# Patient Record
Sex: Female | Born: 1970 | Race: White | Hispanic: No | Marital: Married | Smoking: Never smoker
Health system: Southern US, Community
[De-identification: ages and names within clinical notes are randomized; demographics above are authoritative.]

## PROBLEM LIST (undated history)

## (undated) DIAGNOSIS — R51 Headache: Secondary | ICD-10-CM

## (undated) DIAGNOSIS — O34219 Maternal care for unspecified type scar from previous cesarean delivery: Secondary | ICD-10-CM

## (undated) DIAGNOSIS — N390 Urinary tract infection, site not specified: Secondary | ICD-10-CM

## (undated) DIAGNOSIS — E0590 Thyrotoxicosis, unspecified without thyrotoxic crisis or storm: Secondary | ICD-10-CM

---

## 2003-12-02 ENCOUNTER — Other Ambulatory Visit: Admission: RE | Admit: 2003-12-02 | Discharge: 2003-12-02 | Payer: Self-pay | Admitting: Obstetrics and Gynecology

## 2004-01-29 ENCOUNTER — Emergency Department (HOSPITAL_COMMUNITY): Admission: EM | Admit: 2004-01-29 | Discharge: 2004-01-29 | Payer: Self-pay

## 2004-11-18 ENCOUNTER — Other Ambulatory Visit: Admission: RE | Admit: 2004-11-18 | Discharge: 2004-11-18 | Payer: Self-pay | Admitting: Obstetrics and Gynecology

## 2005-05-11 ENCOUNTER — Emergency Department (HOSPITAL_COMMUNITY): Admission: EM | Admit: 2005-05-11 | Discharge: 2005-05-11 | Payer: Self-pay | Admitting: Family Medicine

## 2006-04-14 ENCOUNTER — Ambulatory Visit (HOSPITAL_COMMUNITY): Admission: RE | Admit: 2006-04-14 | Discharge: 2006-04-14 | Payer: Self-pay | Admitting: Obstetrics and Gynecology

## 2006-04-27 ENCOUNTER — Ambulatory Visit (HOSPITAL_COMMUNITY): Admission: RE | Admit: 2006-04-27 | Discharge: 2006-04-27 | Payer: Self-pay | Admitting: Obstetrics and Gynecology

## 2006-05-31 ENCOUNTER — Ambulatory Visit (HOSPITAL_COMMUNITY): Admission: RE | Admit: 2006-05-31 | Discharge: 2006-05-31 | Payer: Self-pay | Admitting: Obstetrics and Gynecology

## 2006-06-27 ENCOUNTER — Ambulatory Visit (HOSPITAL_COMMUNITY): Admission: RE | Admit: 2006-06-27 | Discharge: 2006-06-27 | Payer: Self-pay | Admitting: Obstetrics and Gynecology

## 2006-07-25 ENCOUNTER — Ambulatory Visit (HOSPITAL_COMMUNITY): Admission: RE | Admit: 2006-07-25 | Discharge: 2006-07-25 | Payer: Self-pay | Admitting: Obstetrics and Gynecology

## 2006-07-27 ENCOUNTER — Inpatient Hospital Stay (HOSPITAL_COMMUNITY): Admission: AD | Admit: 2006-07-27 | Discharge: 2006-07-27 | Payer: Self-pay | Admitting: Obstetrics & Gynecology

## 2006-08-15 ENCOUNTER — Ambulatory Visit (HOSPITAL_COMMUNITY): Admission: RE | Admit: 2006-08-15 | Discharge: 2006-08-15 | Payer: Self-pay | Admitting: Obstetrics and Gynecology

## 2006-08-29 ENCOUNTER — Ambulatory Visit (HOSPITAL_COMMUNITY): Admission: RE | Admit: 2006-08-29 | Discharge: 2006-08-29 | Payer: Self-pay | Admitting: Obstetrics and Gynecology

## 2006-09-04 ENCOUNTER — Inpatient Hospital Stay (HOSPITAL_COMMUNITY): Admission: AD | Admit: 2006-09-04 | Discharge: 2006-09-04 | Payer: Self-pay | Admitting: Obstetrics and Gynecology

## 2006-09-12 ENCOUNTER — Encounter: Payer: Self-pay | Admitting: Obstetrics and Gynecology

## 2006-09-12 ENCOUNTER — Inpatient Hospital Stay (HOSPITAL_COMMUNITY): Admission: AD | Admit: 2006-09-12 | Discharge: 2006-09-15 | Payer: Self-pay | Admitting: Obstetrics and Gynecology

## 2006-09-13 ENCOUNTER — Encounter (INDEPENDENT_AMBULATORY_CARE_PROVIDER_SITE_OTHER): Payer: Self-pay | Admitting: Obstetrics and Gynecology

## 2008-06-10 ENCOUNTER — Inpatient Hospital Stay (HOSPITAL_COMMUNITY): Admission: AD | Admit: 2008-06-10 | Discharge: 2008-06-13 | Payer: Self-pay | Admitting: Obstetrics & Gynecology

## 2009-03-22 IMAGING — US US OB FOLLOW-UP
1 series · 14 of 28 positions shown · non-contrast
Comparison: none

OBSTETRICAL ULTRASOUND:
 This ultrasound was performed in The [HOSPITAL], and the [facility] report will be stored to [REDACTED] PACS.

[Series 1: us ob follow-up · 14 of 44 slices shown]
[im 2/44]
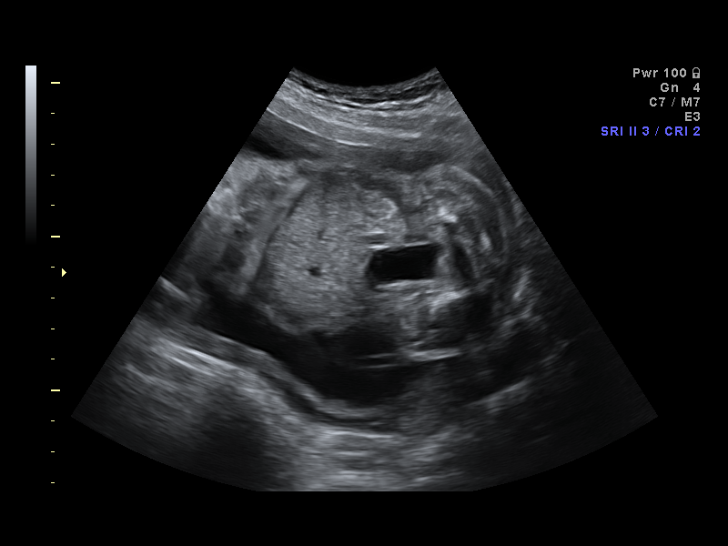
[im 5/44]
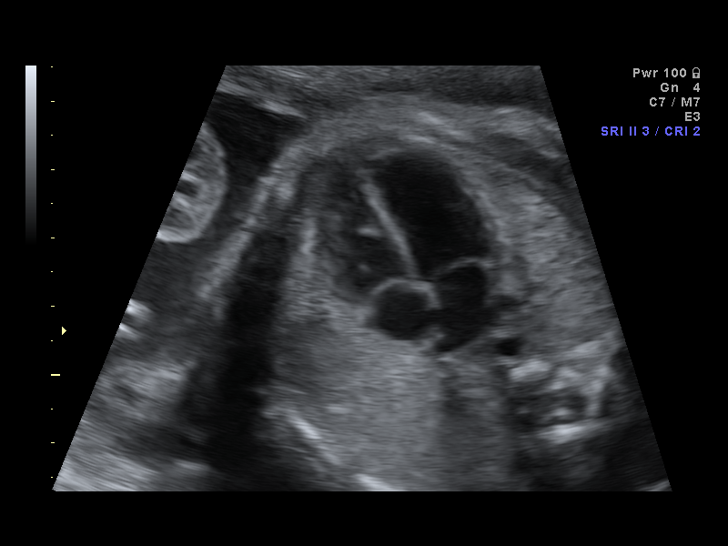
[im 8/44]
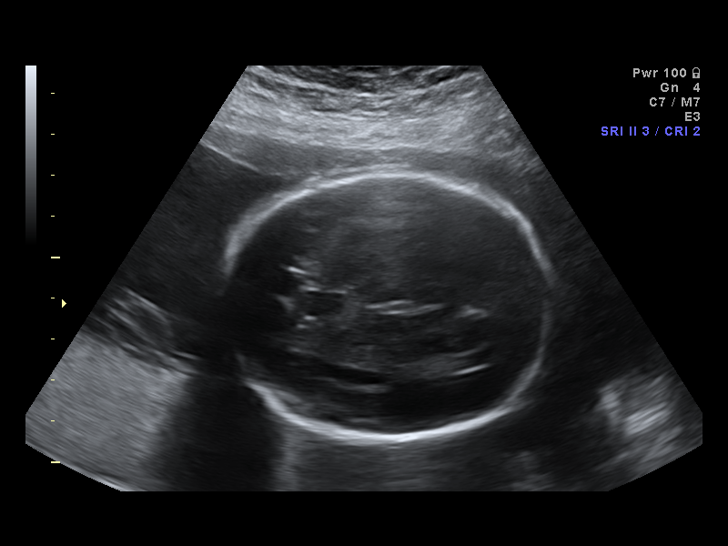
[im 12/44]
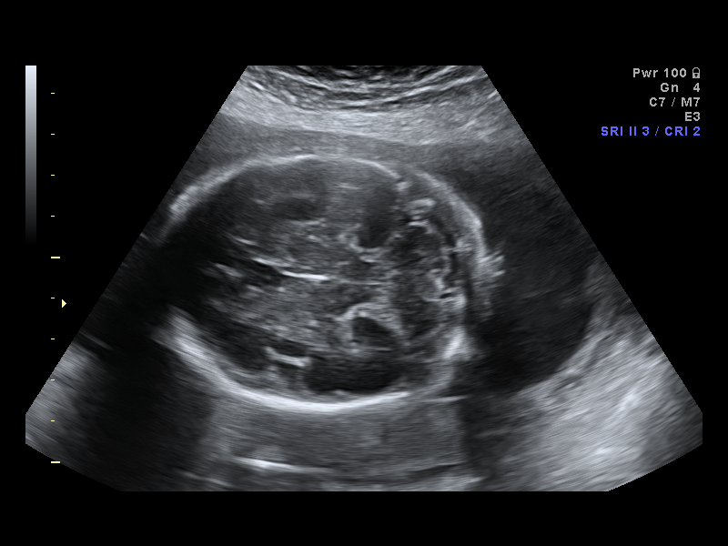
[im 15/44]
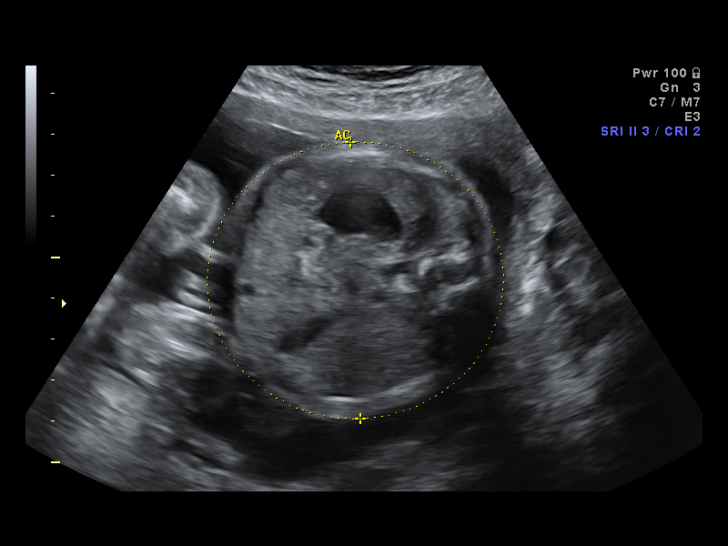
[im 18/44]
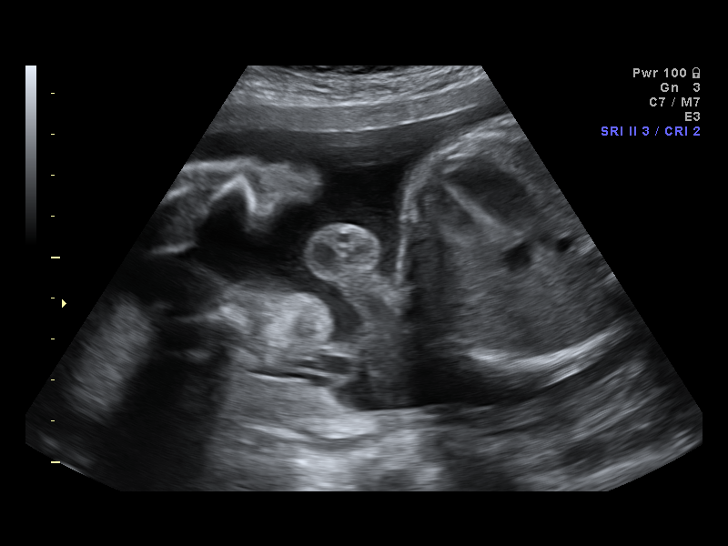
[im 21/44]
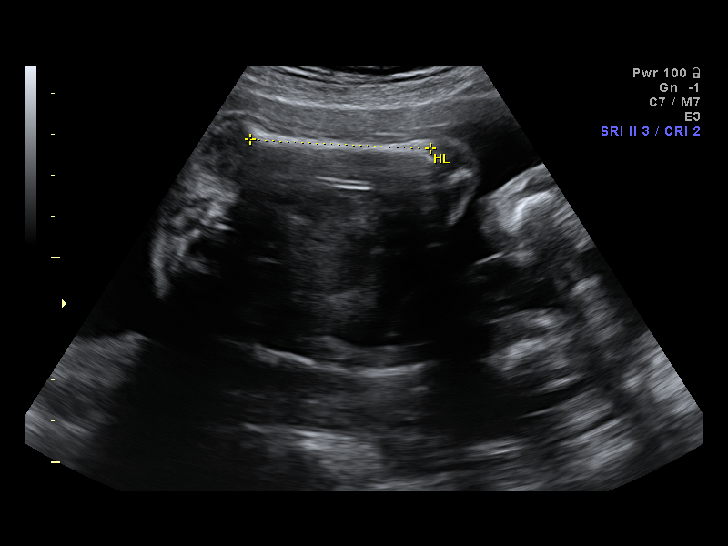
[im 24/44]
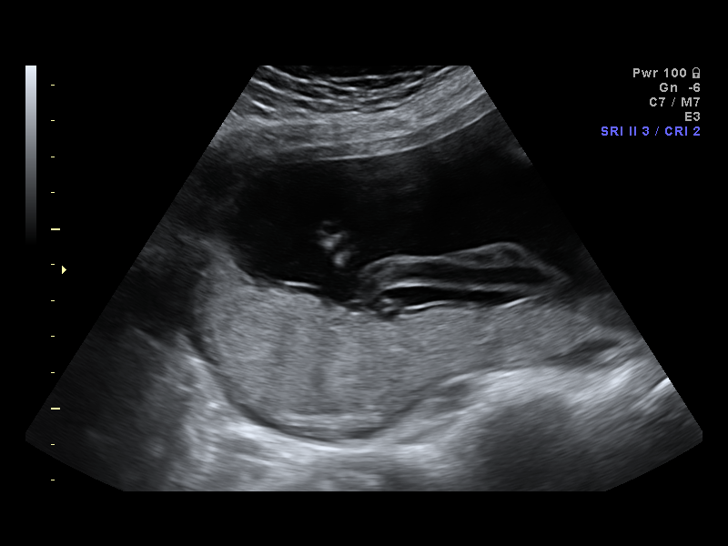
[im 28/44]
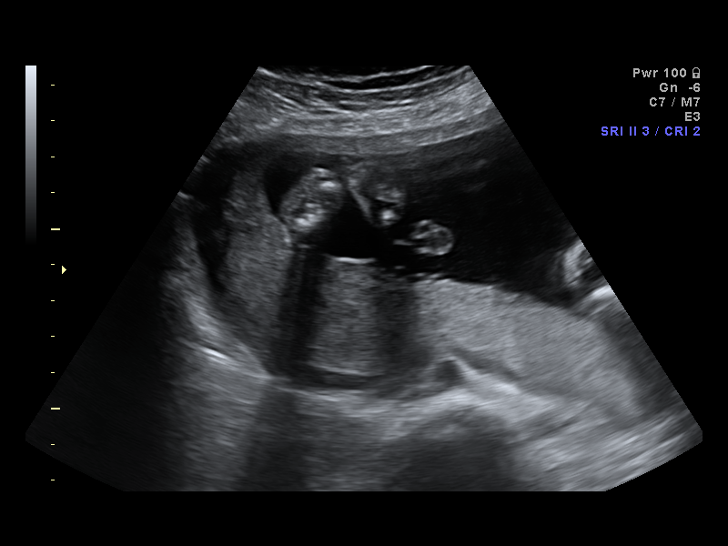
[im 31/44]
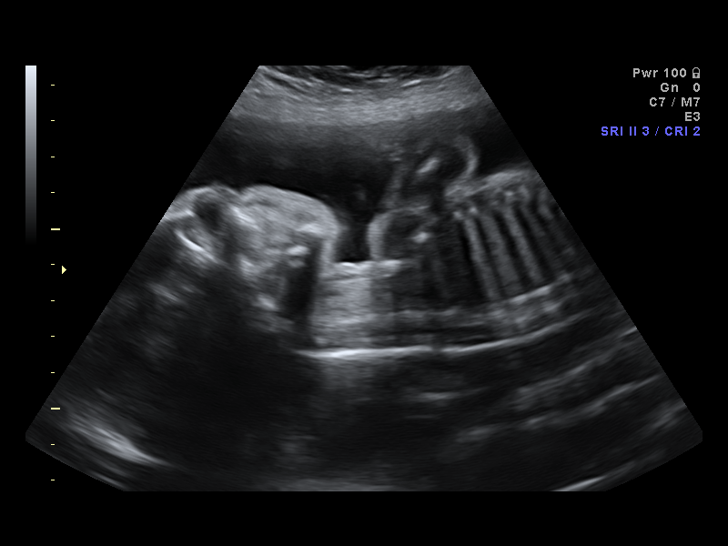
[im 34/44]
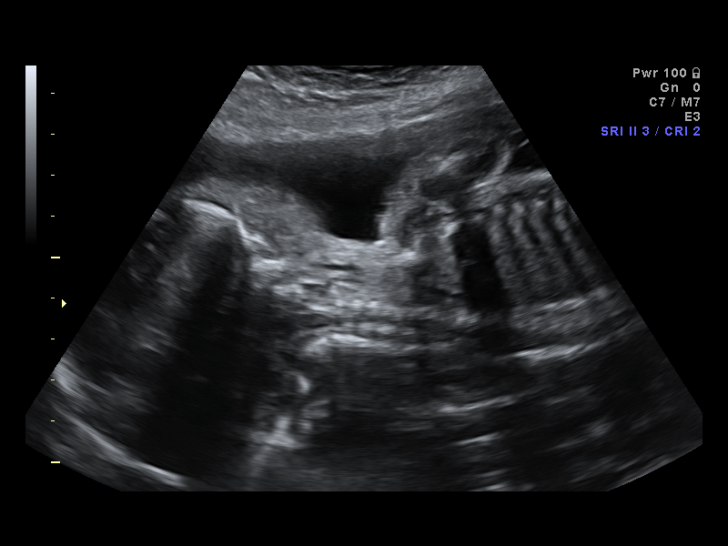
[im 37/44]
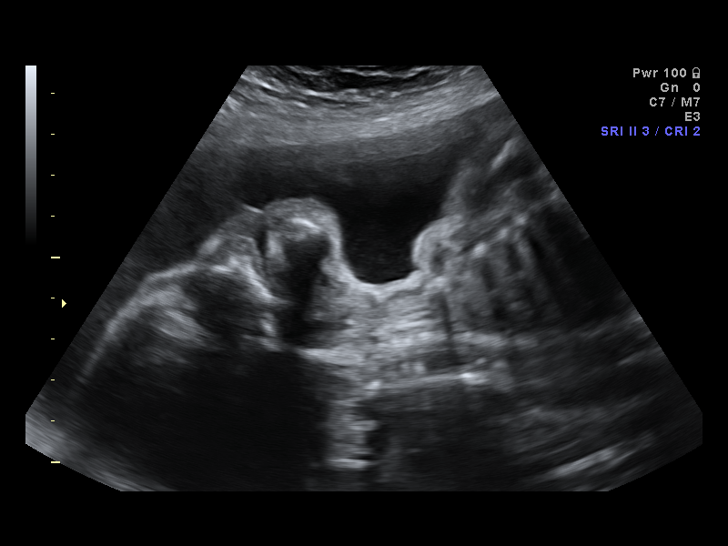
[im 40/44]
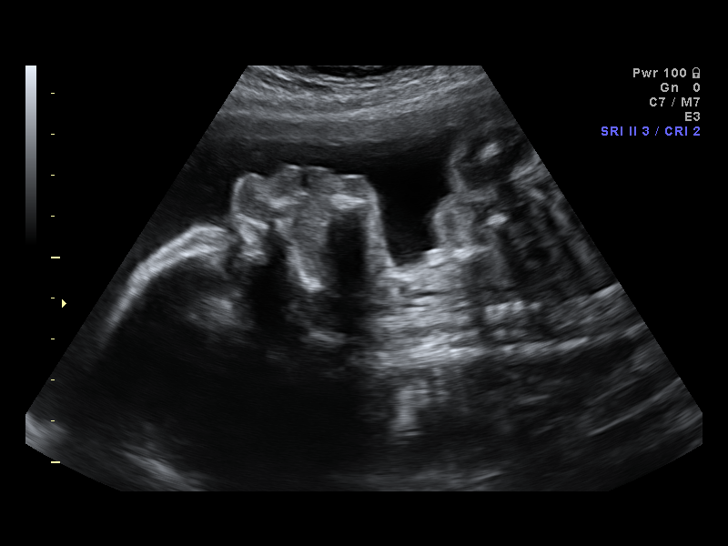
[im 44/44]
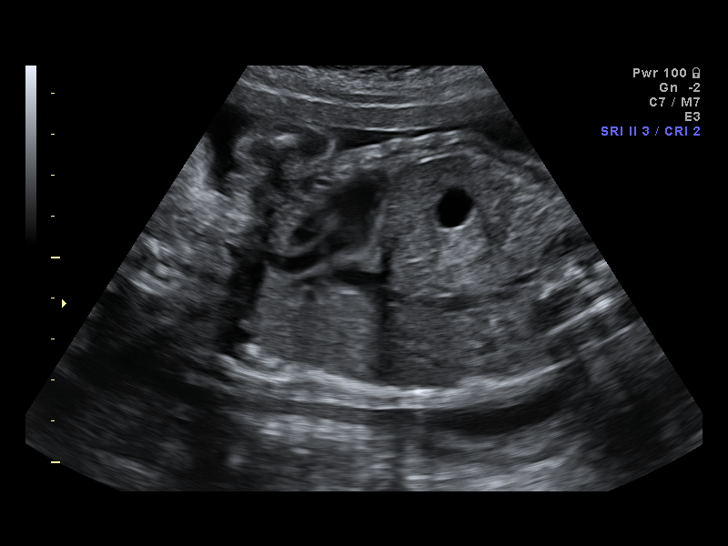

[14 of 28 positions shown; findings below may reference images not displayed]

IMPRESSION: The [facility] report has also been faxed to the ordering physician.

## 2009-05-10 IMAGING — US US OB FOLLOW-UP
1 series · 14 of 28 positions shown · non-contrast
Comparison: none

OBSTETRICAL ULTRASOUND:
 This ultrasound was performed in The [HOSPITAL], and the [facility] report will be stored to [REDACTED] PACS.

[Series 1: us ob follow-up · 14 of 30 slices shown]
[im 2/30]
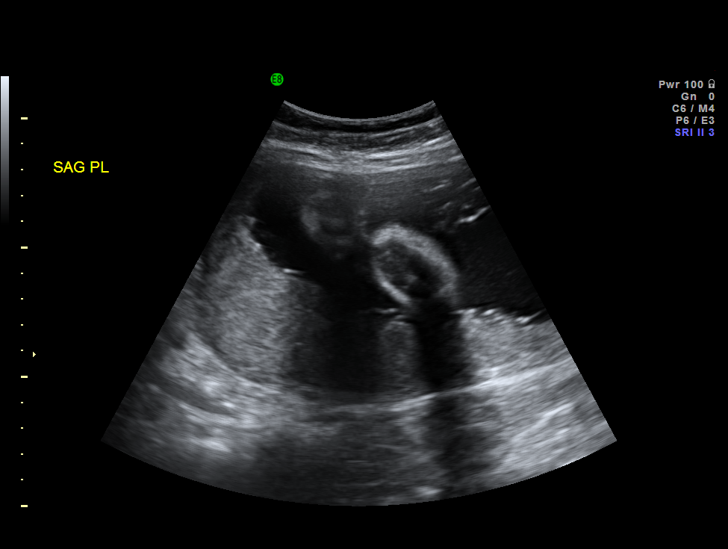
[im 4/30]
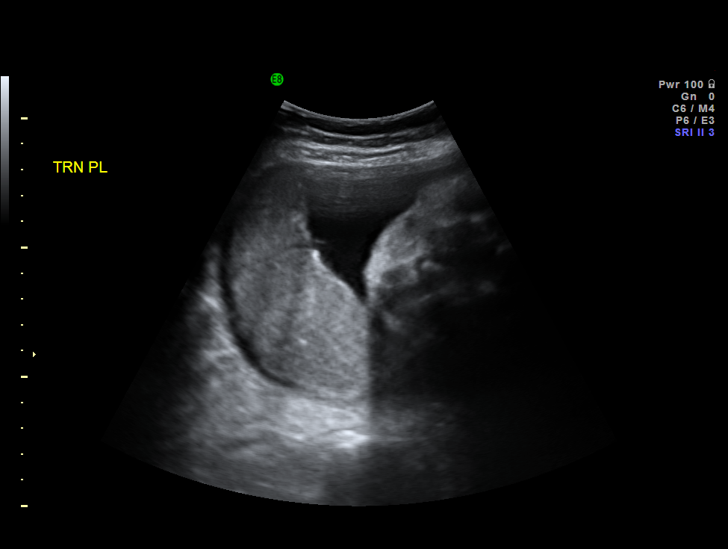
[im 6/30]
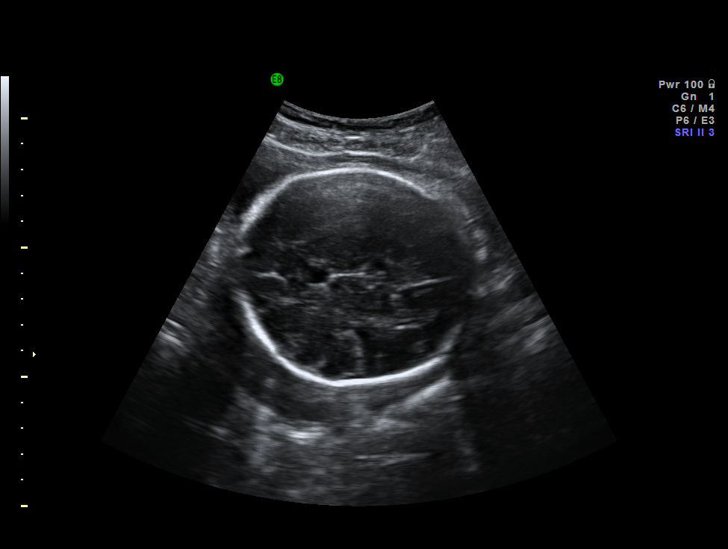
[im 8/30]
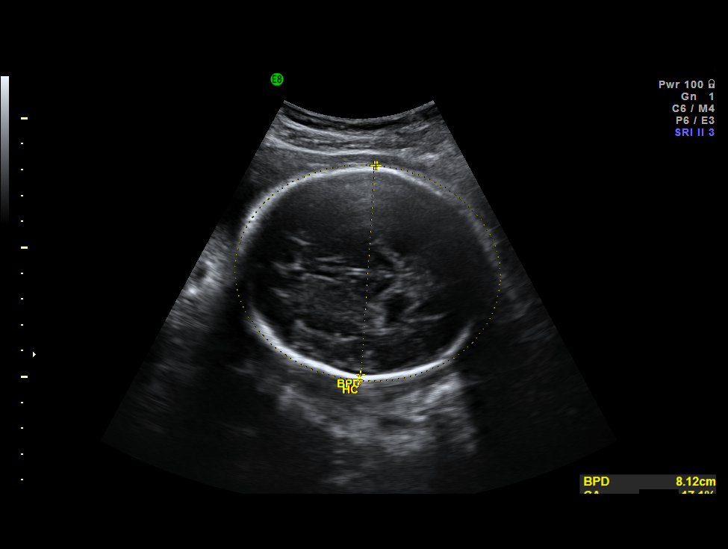
[im 10/30]
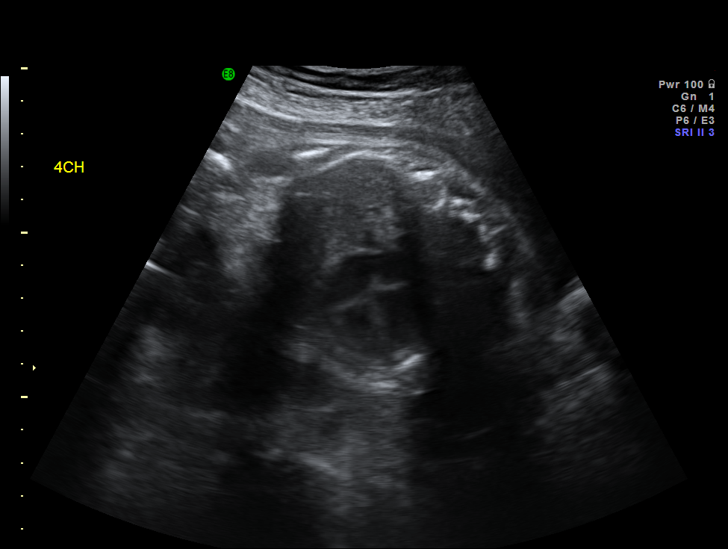
[im 12/30]
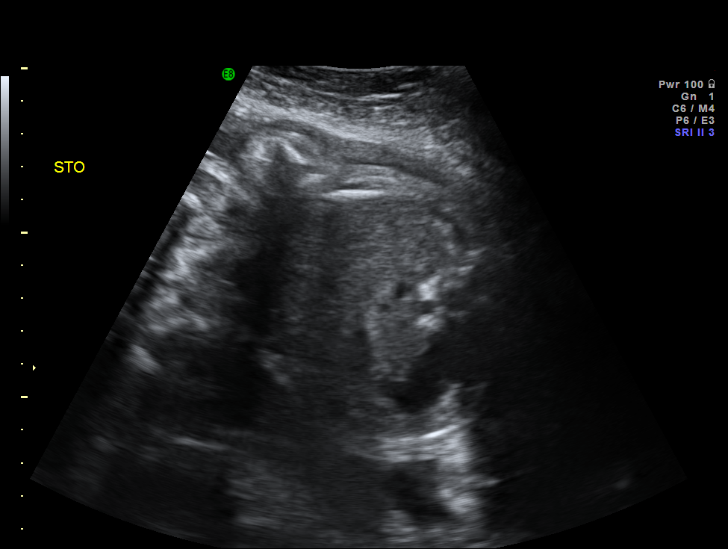
[im 14/30]
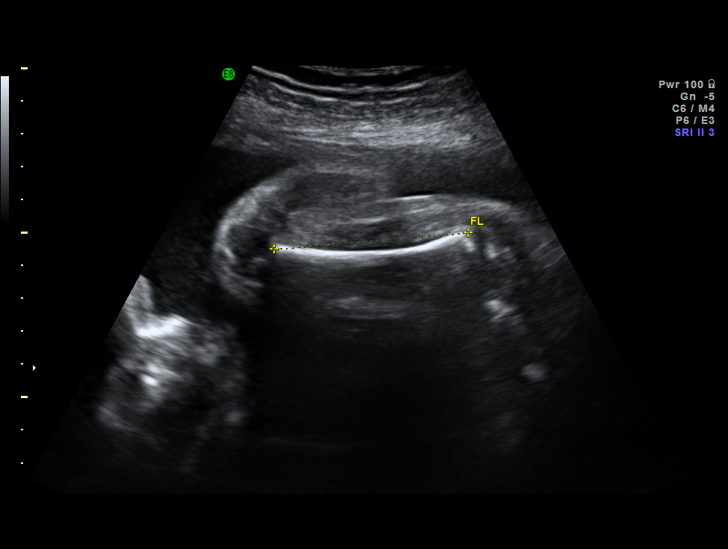
[im 17/30]
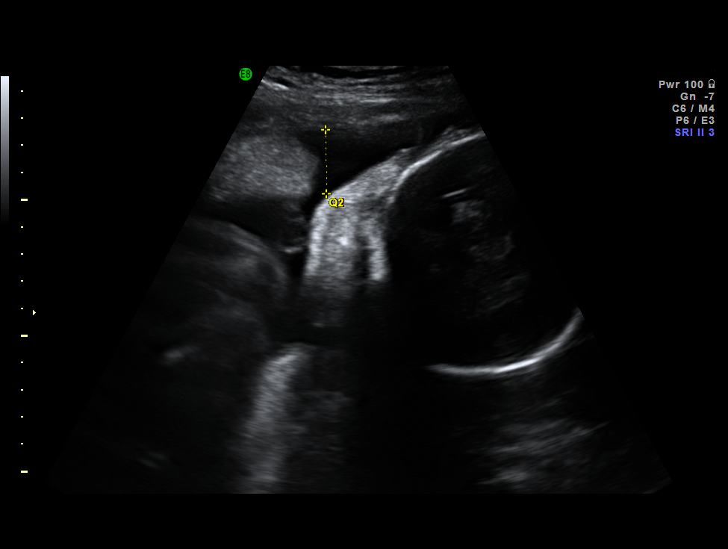
[im 19/30]
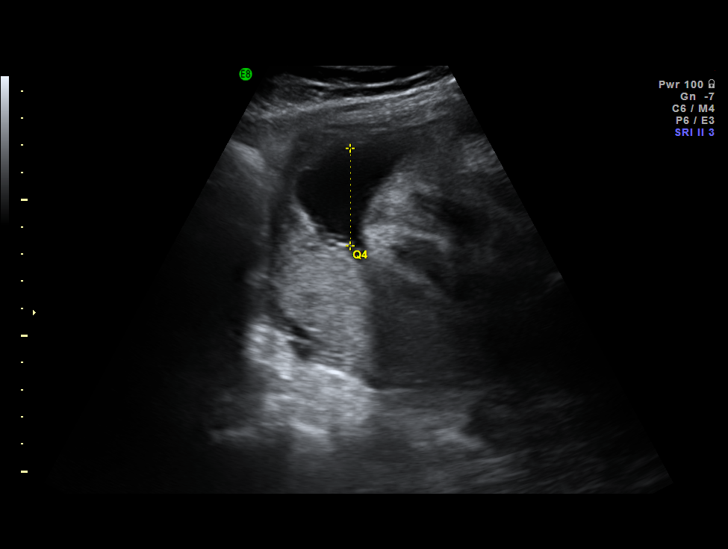
[im 21/30]
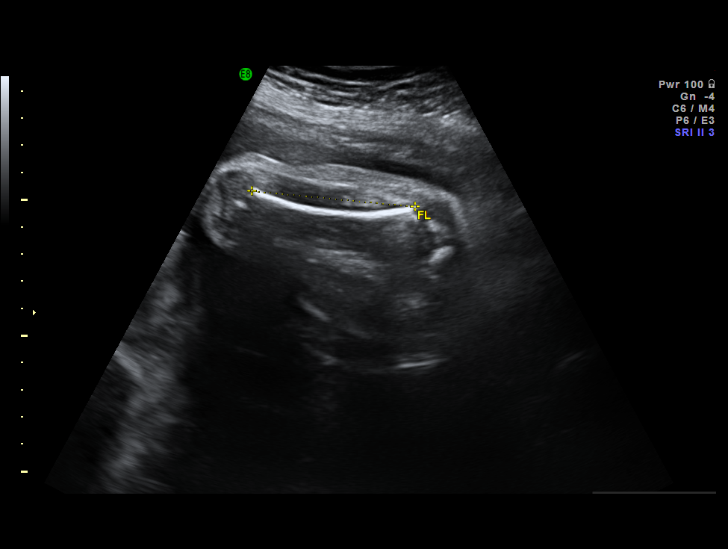
[im 23/30]
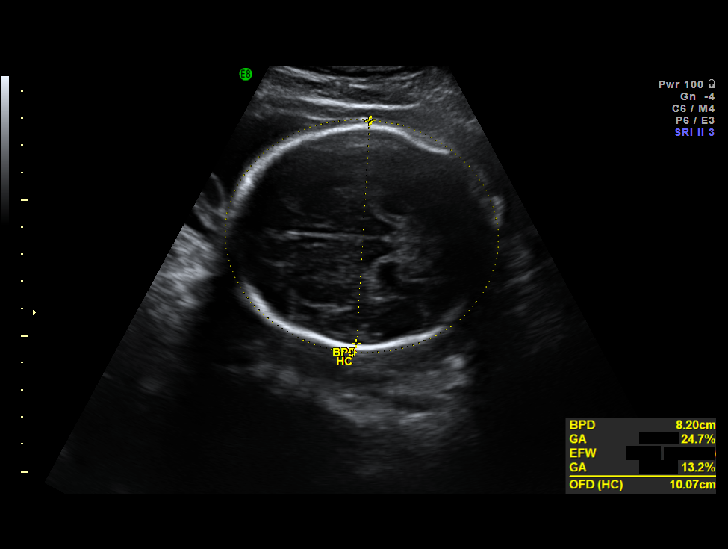
[im 25/30]
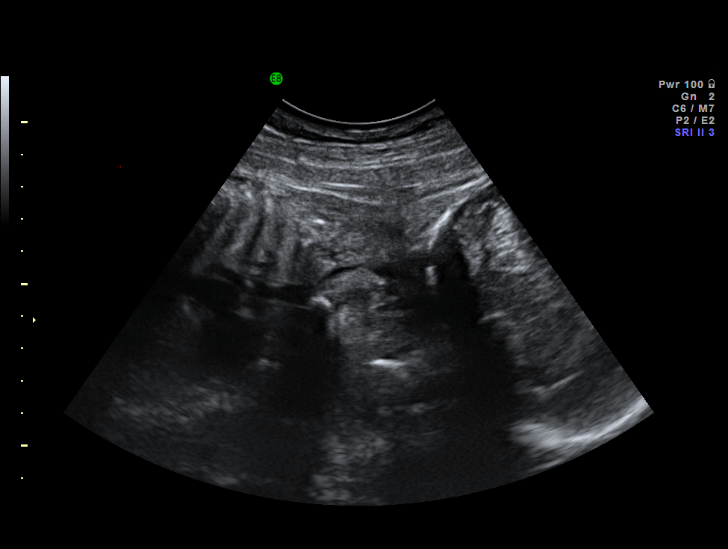
[im 27/30]
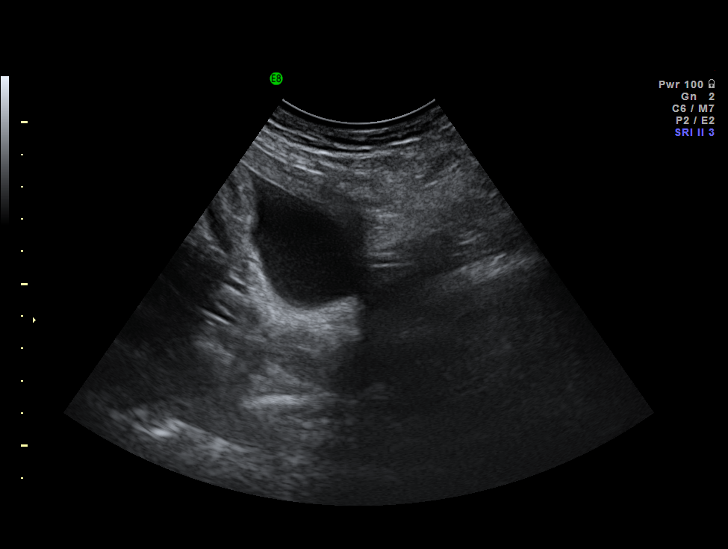
[im 30/30]
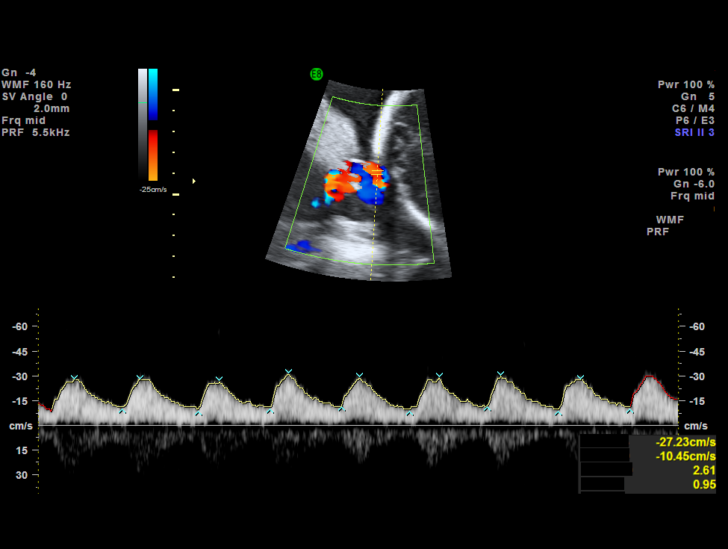

[14 of 28 positions shown; findings below may reference images not displayed]

IMPRESSION: The [facility] report has also been faxed to the ordering physician.

## 2010-01-03 HISTORY — DX: Maternal care for unspecified type scar from previous cesarean delivery: O34.219

## 2010-01-03 NOTE — L&D Delivery Note (Signed)
Delivery Note At  a viable and healthy female sex was delivered via  (Presentation: ; OA ).  APGAR:8 9, ; [id number].   Placenta status: , S/I . 3 vessel; Cord:  with the following complications:none .  Cord pH: na  Anesthesia:  local Episiotomy: none Lacerations: 2nd degree Suture Repair: 2.0 vicryl rapide Est. Blood Loss (mL): 300  Mom to postpartum.  Baby to nursery-stable.  Konica Stankowski J 09/20/2010, 10:32 AM

## 2010-02-09 LAB — RPR: RPR: NONREACTIVE

## 2010-02-09 LAB — ANTIBODY SCREEN: Antibody Screen: NEGATIVE

## 2010-02-09 LAB — ABO/RH

## 2010-03-11 ENCOUNTER — Other Ambulatory Visit (HOSPITAL_COMMUNITY): Payer: Self-pay | Admitting: Obstetrics and Gynecology

## 2010-03-11 DIAGNOSIS — R772 Abnormality of alphafetoprotein: Secondary | ICD-10-CM

## 2010-03-11 DIAGNOSIS — IMO0002 Reserved for concepts with insufficient information to code with codable children: Secondary | ICD-10-CM

## 2010-03-11 DIAGNOSIS — Z0489 Encounter for examination and observation for other specified reasons: Secondary | ICD-10-CM

## 2010-03-11 DIAGNOSIS — O09529 Supervision of elderly multigravida, unspecified trimester: Secondary | ICD-10-CM

## 2010-04-08 ENCOUNTER — Encounter (HOSPITAL_COMMUNITY): Payer: Self-pay

## 2010-04-08 ENCOUNTER — Other Ambulatory Visit (HOSPITAL_COMMUNITY): Payer: Self-pay

## 2010-04-12 LAB — CBC
HCT: 31.6 % — ABNORMAL LOW (ref 36.0–46.0)
HCT: 35.3 % — ABNORMAL LOW (ref 36.0–46.0)
Hemoglobin: 11.2 g/dL — ABNORMAL LOW (ref 12.0–15.0)
Hemoglobin: 12.7 g/dL (ref 12.0–15.0)
MCHC: 34.5 g/dL (ref 30.0–36.0)
MCHC: 35 g/dL (ref 30.0–36.0)
MCV: 91.1 fL (ref 78.0–100.0)
MCV: 91.2 fL (ref 78.0–100.0)
MCV: 92.3 fL (ref 78.0–100.0)
Platelets: 107 10*3/uL — ABNORMAL LOW (ref 150–400)
Platelets: 148 10*3/uL — ABNORMAL LOW (ref 150–400)
RBC: 3.98 MIL/uL (ref 3.87–5.11)
WBC: 12.4 10*3/uL — ABNORMAL HIGH (ref 4.0–10.5)
WBC: 12.6 10*3/uL — ABNORMAL HIGH (ref 4.0–10.5)
WBC: 13 10*3/uL — ABNORMAL HIGH (ref 4.0–10.5)

## 2010-04-12 LAB — RH IMMUNE GLOB WKUP(>/=20WKS)(NOT WOMEN'S HOSP): Fetal Screen: NEGATIVE

## 2010-04-12 LAB — RPR: RPR Ser Ql: NONREACTIVE

## 2010-04-12 LAB — CCBB MATERNAL DONOR DRAW

## 2010-04-13 ENCOUNTER — Ambulatory Visit (HOSPITAL_COMMUNITY)
Admission: RE | Admit: 2010-04-13 | Discharge: 2010-04-13 | Disposition: A | Discharge status: Home / Self Care | Payer: 59 | Source: Ambulatory Visit | Attending: Obstetrics and Gynecology | Admitting: Obstetrics and Gynecology

## 2010-04-13 DIAGNOSIS — Z363 Encounter for antenatal screening for malformations: Secondary | ICD-10-CM | POA: Insufficient documentation

## 2010-04-13 DIAGNOSIS — Z1389 Encounter for screening for other disorder: Secondary | ICD-10-CM | POA: Insufficient documentation

## 2010-04-13 DIAGNOSIS — Z0489 Encounter for examination and observation for other specified reasons: Secondary | ICD-10-CM

## 2010-04-13 DIAGNOSIS — IMO0002 Reserved for concepts with insufficient information to code with codable children: Secondary | ICD-10-CM

## 2010-04-13 DIAGNOSIS — O3500X0 Maternal care for (suspected) central nervous system malformation or damage in fetus, unspecified, not applicable or unspecified: Secondary | ICD-10-CM | POA: Insufficient documentation

## 2010-04-13 DIAGNOSIS — O350XX0 Maternal care for (suspected) central nervous system malformation in fetus, not applicable or unspecified: Secondary | ICD-10-CM | POA: Insufficient documentation

## 2010-04-13 DIAGNOSIS — O09529 Supervision of elderly multigravida, unspecified trimester: Secondary | ICD-10-CM

## 2010-04-13 DIAGNOSIS — R772 Abnormality of alphafetoprotein: Secondary | ICD-10-CM

## 2010-04-13 DIAGNOSIS — O358XX0 Maternal care for other (suspected) fetal abnormality and damage, not applicable or unspecified: Secondary | ICD-10-CM | POA: Insufficient documentation

## 2010-05-18 NOTE — H&P (Signed)
NAMEKamden, Reber Josalynn              ACCOUNT NO.:  [id number]   MEDICAL RECORD NO.:  [id number]          PATIENT TYPE:  INP   LOCATION:  NA                            FACILITY:  [facility]   PHYSICIAN:  Duke Salvia. Marcelle Overlie, M.D.DATE OF BIRTH:  [birth year]   DATE OF ADMISSION:  DATE OF DISCHARGE:                              HISTORY & PHYSICAL   CHIEF COMPLAINT:  Decreased fluid at term.   HISTORY OF PRESENT ILLNESS:  A 40 year old G1, P0.  EDD is September 25.  EGA is 37+ weeks.  Has a favorable cervix.  She had a follow-up  ultrasound with MFM today, showing growth less than the 10th percentile,  and they had recommended delivery.  It is felt that her SGA has been  secondary to being on PTU.  Her weekly NSTs have been reactive.  She is  Rh negative, is followed by Dr. Talmage Nap for her thyroid monitoring on her  PTU.   PAST MEDICAL HISTORY:  For details, please see the [facility] form.   PHYSICAL EXAMINATION:  VITAL SIGNS:  Temp 98.2, blood pressure 120/68.  HEENT:  Unremarkable.  NECK:  Supple without masses.  LUNGS:  Clear.  CARDIOVASCULAR:  Regular rate and rhythm without murmurs, rubs, gallops.  BREASTS:  Not examined.  ABDOMEN:  Fundal height was 35.  Fetal heart rate 140, cervix was 2, 50%  vertex, intact membranes.  EXTREMITIES:  Unremarkable.  NEUROLOGIC:  Unremarkable.   IMPRESSION:  1. 37-1/2 to 38-week intrauterine pregnancy.  2. Hyperthyroidism, currently on PTU.  3. SGA.   PLAN:  Will admit for AROM induction.      Richard M. Marcelle Overlie, M.D.  Electronically Signed     RMH/MEDQ  D:  09/12/2006  T:  09/12/2006  Job:  [id number]   cc:   Labor and Delivery Chart

## 2010-05-18 NOTE — Op Note (Signed)
NAMELiisa, Picone Rubio              ACCOUNT NO.:  [id number]   MEDICAL RECORD NO.:  [id number]          PATIENT TYPE:  INP   LOCATION:  9132                          FACILITY:  [facility]   PHYSICIAN:  Juluis Mire, M.D.   DATE OF BIRTH:  1970/02/24   DATE OF PROCEDURE:  09/13/2006  DATE OF DISCHARGE:                               OPERATIVE REPORT   PREOPERATIVE DIAGNOSES:  1. Intrauterine pregnancy at term.  2. Intrauterine growth retardation.  3. Induction of labor with repetitive late decelerations.   POSTOPERATIVE DIAGNOSES:  1. Intrauterine pregnancy at term.  2. Intrauterine growth retardation.  3. Induction of labor with repetitive late decelerations.   PROCEDURE:  Low transverse cesarean section.   SURGEON:  Juluis Mire, M.D.   ANESTHESIA:  Spinal.   ESTIMATED BLOOD LOSS:  600 mL.   PACKS AND DRAINS:  None.   INTRAOPERATIVE BLOOD PLACED:  None.   COMPLICATIONS:  None.   INDICATIONS:  The patient is a 40 year old female who was admitted on  September 12, 2006, with an SGA baby for induction of labor.  She was  Cytotec through the night.  During the night, there were variable  decelerations, but resolved with position changes.  The following  morning she was 3-4 cm.  We did artificial rupture of membranes with  retrieval of clear fluid.  We put in an IUPC and a scalp electrode.  Pitocin had been discontinued and she continued the labor pattern  without the need for Pitocin.  Baby started having repetitive late  decelerations despite position changes, hydration and O2.  In view of  the repetitive decelerations, we rechecked the cervix.  It was  unchanged, remained approximately 4 cm.  The decision was to proceed  with primary cesarean section due to nonreassuring fetal heart rate.  The risks were discussed including the risk of infection, the risk of  hemorrhage that could require transfusion with risk of AIDS or  hepatitis, risk of injury to adjacent organs  including bladder, bowel,  ureters that could require further exploratory surgery, risk of deep  venous thrombosis and pulmonary embolus.   DESCRIPTION OF PROCEDURE:  Patient was taken to the OR and placed in the  supine position with left lateral tilt.  After satisfactory level of  spinal anesthesia was obtained, the abdomen was prepped out with  Betadine and draped as a sterile field.  A low transverse skin incision  made with a knife and carried through subcutaneous tissue.  Anterior  rectus fascia entered sharply and incision in fascia extended laterally.  Fascia taken off the muscle superiorly and inferiorly.  Rectus muscles  were separated in the midline.  Perineum was entered sharply.  Incision  of perineum extended both superiorly and inferiorly.  A low transverse  bladder flap was developed.  A low transverse uterine incision was begun  with a knife and extended laterally using manual traction.  The infant  presented in the vertex presentation, was delivered with elevation of  the head and fundal pressure.  The infant was a viable female, Apgars  were 9/10.  PH and  weight are pending.  There was no signs of any cord  entanglement, no signs of an abruption, no obvious cause for the  decelerations.  Placenta was delivered manually and sent for  pathological review.  Uterus was exteriorized for closure.  Tubes and  ovaries were unremarkable.  Uterus closed with running locking suture of  0 chromic using two-layer closure technique.  We had excellent  hemostasis and clear urine output.  Uterus was returned to the abdominal  cavity.  At this point in time, muscles and peritoneum closed with  running suture of 3-0 Vicryl.  Fascia closed with running suture of 0  PDS.  Skin was closed with staples and Steri-Strips.  Sponge, instrument  and needle count reported as correct by the circulating nurse x2.  Foley  catheter remained clear at time of closure.  The patient tolerated the   procedure well and returned to the recovery room in good condition.      Juluis Mire, M.D.  Electronically Signed     JSM/MEDQ  D:  09/13/2006  T:  09/13/2006  Job:  [id number]

## 2010-05-21 NOTE — Discharge Summary (Signed)
NAMEKELLYJO, EDGREN              ACCOUNT NO.:  [id number]   MEDICAL RECORD NO.:  [id number]          PATIENT TYPE:  INP   LOCATION:  9132                          [facility]ACILITY:  [facility]   PHYSICIAN:  Zelphia Cairo, MD    DATE OF BIRTH:  April 30, 1970   DATE OF ADMISSION:  09/12/2006  DATE OF DISCHARGE:  09/15/2006                               DISCHARGE SUMMARY   ADMITTING DIAGNOSIS:  1. Intrauterine pregnancy at 37-5/7 weeks estimated gestational age.  2. Induction of labor secondary to intrauterine growth retardation.   DISCHARGE DIAGNOSIS:  1. Status post low transverse cesarean section secondary to      nonreassuring fetal heart tones.  2. A viable female infant.   PROCEDURE:  Primary low transverse cesarean section.   REASON FOR ADMISSION:  Please see written H&P.   HOSPITAL COURSE:  The patient is 40 year old gravida 1, para 0 that was  admitted to [facility] at 37-5/7 weeks for two-stage  induction of labor secondary to intrauterine growth retardation.  The  patient was placed on Cytotec during the night.  On the following  morning, she was started on Pitocin.  Shortly after rupture of  membranes, intrauterine pressure catheter was placed along with the  scalp electrode.  The patient was in a good labor pattern and Pitocin  had been discontinued.  Baby did start having some repetitive late  decelerations.  Despite position changes, hydration and oxygen  administration, the repetitive deceleration pattern continued.  Cervix  was examined and found to be 4 cm dilated due to the cervix essentially  unchanged.  Decision was made to proceed with a primary low transverse  cesarean section.  The patient was then transferred to the operating  room where spinal anesthesia was administered without difficulty.  Low  transverse incision was made with delivery of a viable female infant  weighing 5 pounds 12 ounces, Apgars of 9 at one minute and 10 at 5  minutes.  The patient  tolerated procedure well and was taken to the  recovery room in stable condition.  On the following morning, the  patient was without complaint.  Vital signs were stable.  She was  afebrile.  Lungs were clear to auscultation.  Heart was regular rate and  rhythm.  Abdomen: Soft.  Good return of bowel function.  Abdominal  dressing was noted to be clean, dry and intact.  Laboratory findings  revealed hemoglobin of 12.0.  On postoperative day #2, the patient did  desire early discharge.  Vital signs were stable.  She is afebrile.  Abdomen soft.  Fundus firm and nontender.  Incision was clean, dry and  intact with small ecchymosis noted inferior to the incisional site.  Discharge instructions were reviewed and the patient was later  discharged home.   CONDITION ON DISCHARGE:  Good.   DISCHARGE INSTRUCTIONS:  1. Diet regular as tolerated.  2. Activity:  No heavy lifting, no driving x2 weeks, no vaginal entry.   FOLLOW UP:  Patient to follow up in the office in 2-3 days for staple  removal.  She is to call for  temperature greater than 100 degrees,  persistent nausea, vomiting, heavy vaginal bleeding and/or redness or  drainage from the incisional site.   DISCHARGE MEDICATIONS:  1. Tylox #30 one p.o. q.4-6 h p.r.n.  2. Motrin 600 mg every 6 h.  3. Prenatal vitamins one p.o. daily.  4. Colace one p.o. daily.      Julio Sicks, N.P.      Zelphia Cairo, MD  Electronically Signed    CC/MEDQ  D:  10/10/2006  T:  10/10/2006  Job:  [id number]

## 2010-08-23 LAB — STREP B DNA PROBE: GBS: NEGATIVE

## 2010-09-20 ENCOUNTER — Inpatient Hospital Stay (HOSPITAL_COMMUNITY)
Admission: AD | Admit: 2010-09-20 | Discharge: 2010-09-21 | DRG: 775 | Disposition: A | Discharge status: Home / Self Care | Payer: 59 | Source: Ambulatory Visit | Attending: Obstetrics and Gynecology | Admitting: Obstetrics and Gynecology

## 2010-09-20 ENCOUNTER — Encounter (HOSPITAL_COMMUNITY): Payer: Self-pay | Admitting: *Deleted

## 2010-09-20 DIAGNOSIS — O09529 Supervision of elderly multigravida, unspecified trimester: Secondary | ICD-10-CM | POA: Diagnosis present

## 2010-09-20 DIAGNOSIS — O34219 Maternal care for unspecified type scar from previous cesarean delivery: Secondary | ICD-10-CM | POA: Diagnosis present

## 2010-09-20 HISTORY — DX: Thyrotoxicosis, unspecified without thyrotoxic crisis or storm: E05.90

## 2010-09-20 HISTORY — DX: Urinary tract infection, site not specified: N39.0

## 2010-09-20 HISTORY — DX: Headache: R51

## 2010-09-20 LAB — CBC
HCT: 36.9 % (ref 36.0–46.0)
MCH: 30.1 pg (ref 26.0–34.0)
MCV: 90.4 fL (ref 78.0–100.0)
Platelets: 181 10*3/uL (ref 150–400)
RBC: 4.08 MIL/uL (ref 3.87–5.11)
RDW: 14.1 % (ref 11.5–15.5)
WBC: 14.2 10*3/uL — ABNORMAL HIGH (ref 4.0–10.5)

## 2010-09-20 LAB — COMPREHENSIVE METABOLIC PANEL
Alkaline Phosphatase: 107 U/L (ref 39–117)
BUN: 17 mg/dL (ref 6–23)
GFR calc Af Amer: >60 mL/min (ref >60)
Glucose, Bld: 91 mg/dL (ref 70–99)
Potassium: 4 mEq/L (ref 3.5–5.1)
Total Bilirubin: 0.2 mg/dL — ABNORMAL LOW (ref 0.3–1.2)
Total Protein: 5.9 g/dL — ABNORMAL LOW (ref 6.0–8.3)

## 2010-09-20 LAB — LACTATE DEHYDROGENASE: LDH: 191 U/L (ref 94–250)

## 2010-09-20 MED ORDER — IBUPROFEN 600 MG PO TABS
600.0000 mg | ORAL_TABLET | Freq: Four times a day (QID) | ORAL | Status: DC | PRN
  Filled 2010-09-20 (×2): qty 1

## 2010-09-20 MED ORDER — DIPHENHYDRAMINE HCL 50 MG/ML IJ SOLN: 12.5000 mg | INTRAMUSCULAR | Status: DC | PRN

## 2010-09-20 MED ORDER — OXYCODONE-ACETAMINOPHEN 5-325 MG PO TABS: 1.0000 | ORAL_TABLET | ORAL | Status: DC | PRN

## 2010-09-20 MED ORDER — FLEET ENEMA 7-19 GM/118ML RE ENEM: 1.0000 | ENEMA | RECTAL | Status: DC | PRN

## 2010-09-20 MED ORDER — ONDANSETRON HCL 4 MG/2ML IJ SOLN: 4.0000 mg | Freq: Four times a day (QID) | INTRAMUSCULAR | Status: DC | PRN

## 2010-09-20 MED ORDER — LACTATED RINGERS IV SOLN: 500.0000 mL | INTRAVENOUS | Status: DC | PRN

## 2010-09-20 MED ORDER — BUTALBITAL-APAP-CAFFEINE 50-325-40 MG PO TABS
1.0000 | ORAL_TABLET | Freq: Two times a day (BID) | ORAL | Status: DC | PRN
  Filled 2010-09-20: qty 1

## 2010-09-20 MED ORDER — BUTORPHANOL TARTRATE 2 MG/ML IJ SOLN
1.0000 mg | Freq: Once | INTRAMUSCULAR | Status: AC
  Administered 2010-09-20: 1 mg via INTRAVENOUS

## 2010-09-20 MED ORDER — EPHEDRINE 5 MG/ML INJ
10.0000 mg | INTRAVENOUS | Status: DC | PRN
  Filled 2010-09-20: qty 4

## 2010-09-20 MED ORDER — LIDOCAINE HCL (PF) 1 % IJ SOLN
30.0000 mL | INTRAMUSCULAR | Status: DC | PRN
  Filled 2010-09-20 (×2): qty 30

## 2010-09-20 MED ORDER — PHENYLEPHRINE 40 MCG/ML (10ML) SYRINGE FOR IV PUSH (FOR BLOOD PRESSURE SUPPORT)
80.0000 ug | PREFILLED_SYRINGE | INTRAVENOUS | Status: DC | PRN
  Filled 2010-09-20: qty 5

## 2010-09-20 MED ORDER — SIMETHICONE 80 MG PO CHEW: 80.0000 mg | CHEWABLE_TABLET | ORAL | Status: DC | PRN

## 2010-09-20 MED ORDER — LACTATED RINGERS IV SOLN: 500.0000 mL | Freq: Once | INTRAVENOUS | Status: DC

## 2010-09-20 MED ORDER — IBUPROFEN 600 MG PO TABS
600.0000 mg | ORAL_TABLET | Freq: Four times a day (QID) | ORAL | Status: DC
  Administered 2010-09-20 – 2010-09-21 (×5): 600 mg via ORAL
  Filled 2010-09-20 (×3): qty 1

## 2010-09-20 MED ORDER — OXYCODONE-ACETAMINOPHEN 5-325 MG PO TABS: 2.0000 | ORAL_TABLET | ORAL | Status: DC | PRN

## 2010-09-20 MED ORDER — BENZOCAINE-MENTHOL 20-0.5 % EX AERO
1.0000 "application " | INHALATION_SPRAY | CUTANEOUS | Status: DC | PRN
  Filled 2010-09-20: qty 56

## 2010-09-20 MED ORDER — OXYTOCIN 10 UNIT/ML IJ SOLN
INTRAMUSCULAR | Status: AC
  Administered 2010-09-20: 10:00:00
  Filled 2010-09-20: qty 2

## 2010-09-20 MED ORDER — WITCH HAZEL-GLYCERIN EX PADS: 1.0000 "application " | MEDICATED_PAD | CUTANEOUS | Status: DC | PRN

## 2010-09-20 MED ORDER — DIPHENHYDRAMINE HCL 25 MG PO CAPS
25.0000 mg | ORAL_CAPSULE | Freq: Four times a day (QID) | ORAL | Status: DC | PRN
  Administered 2010-09-20: 25 mg via ORAL
  Filled 2010-09-20: qty 1

## 2010-09-20 MED ORDER — BISACODYL 10 MG RE SUPP
10.0000 mg | Freq: Every day | RECTAL | Status: DC | PRN
  Filled 2010-09-20: qty 1

## 2010-09-20 MED ORDER — CITRIC ACID-SODIUM CITRATE 334-500 MG/5ML PO SOLN: 30.0000 mL | ORAL | Status: DC | PRN

## 2010-09-20 MED ORDER — BENZOCAINE-MENTHOL 20-0.5 % EX AERO
INHALATION_SPRAY | CUTANEOUS | Status: AC
  Filled 2010-09-20: qty 56

## 2010-09-20 MED ORDER — ONDANSETRON HCL 4 MG/2ML IJ SOLN: 4.0000 mg | INTRAMUSCULAR | Status: DC | PRN

## 2010-09-20 MED ORDER — DIBUCAINE 1 % RE OINT
1.0000 "application " | TOPICAL_OINTMENT | RECTAL | Status: DC | PRN
  Filled 2010-09-20: qty 28

## 2010-09-20 MED ORDER — OXYTOCIN BOLUS FROM INFUSION
500.0000 mL | Freq: Once | INTRAVENOUS | Status: AC
  Administered 2010-09-20: 500 mL via INTRAVENOUS
  Filled 2010-09-20: qty 1000
  Filled 2010-09-20: qty 500

## 2010-09-20 MED ORDER — ACETAMINOPHEN 325 MG PO TABS: 650.0000 mg | ORAL_TABLET | ORAL | Status: DC | PRN

## 2010-09-20 MED ORDER — LANOLIN HYDROUS EX OINT: TOPICAL_OINTMENT | CUTANEOUS | Status: DC | PRN

## 2010-09-20 MED ORDER — ONDANSETRON HCL 4 MG PO TABS: 4.0000 mg | ORAL_TABLET | ORAL | Status: DC | PRN

## 2010-09-20 MED ORDER — TETANUS-DIPHTH-ACELL PERTUSSIS 5-2.5-18.5 LF-MCG/0.5 IM SUSP
0.5000 mL | Freq: Once | INTRAMUSCULAR | Status: AC
  Administered 2010-09-21: 0.5 mL via INTRAMUSCULAR
  Filled 2010-09-20: qty 0.5

## 2010-09-20 MED ORDER — LACTATED RINGERS IV SOLN: INTRAVENOUS | Status: DC

## 2010-09-20 MED ORDER — ZOLPIDEM TARTRATE 5 MG PO TABS: 5.0000 mg | ORAL_TABLET | Freq: Every evening | ORAL | Status: DC | PRN

## 2010-09-20 MED ORDER — FENTANYL 2.5 MCG/ML BUPIVACAINE 1/10 % EPIDURAL INFUSION (WH - ANES): 14.0000 mL/h | INTRAMUSCULAR | Status: DC

## 2010-09-20 MED ORDER — SENNOSIDES-DOCUSATE SODIUM 8.6-50 MG PO TABS
2.0000 | ORAL_TABLET | Freq: Every day | ORAL | Status: DC
  Administered 2010-09-20: 2 via ORAL

## 2010-09-20 MED ORDER — OXYTOCIN 20 UNITS IN LACTATED RINGERS INFUSION - SIMPLE: 125.0000 mL/h | Freq: Once | INTRAVENOUS | Status: DC

## 2010-09-20 MED ORDER — PRENATAL PLUS 27-1 MG PO TABS
1.0000 | ORAL_TABLET | Freq: Every day | ORAL | Status: DC
  Administered 2010-09-21: 1 via ORAL
  Filled 2010-09-20: qty 1

## 2010-09-20 MED ORDER — BUTORPHANOL TARTRATE 2 MG/ML IJ SOLN
INTRAMUSCULAR | Status: AC
  Administered 2010-09-20: 1 mg via INTRAVENOUS
  Filled 2010-09-20: qty 1

## 2010-09-20 NOTE — ED Notes (Signed)
Office called, in prenatal there is documentation that GBS culture obtained- but no documentation as to result.  PT unaware of results.  Spoke with Tammy in office, pt is GBS neg, will fax results

## 2010-09-20 NOTE — Progress Notes (Signed)
Labor started this morning, strong and close, has been 3 cm.  Primary c/s, successful VBAC with last del- went fast 4-10 in an hour

## 2010-09-20 NOTE — H&P (Signed)
Helen Rubio is a 40 y.o. female at [redacted]w[redacted]d presenting for TOLAC. Patient seen at [facility] request in labor room at 10 am, prior to delivery. Strong urge to push present, VE 9 and BBOW by RN. Was 4/70/-2 in MAU 45 min ago. Stand by for impending delivery. Maternal Medical History:  Reason for admission: Reason for admission: contractions and nausea.  Contractions: Onset was 1-2 hours ago.   Frequency: regular.   Perceived severity is strong.    Fetal activity: Perceived fetal activity is normal.   Last perceived fetal movement was within the past hour.    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 Talmage Nap endo Hx C/S x1 and previous VBAC x1 with hx fast labor Rh negative, rhogam prophylaxis 6/30  Prenatal Complications - Diabetes: none.    OB History    Grav Para Term Preterm Abortions TAB SAB Ect Mult Living   4 2 2  0 1 0 1 0 0 2     Past Medical History  Diagnosis Date  . Headache   . Hyperthyroidism   . Urinary tract infection   . VBAC (vaginal birth after Cesarean) 9(/17) 09/20/2010   Past Surgical History  Procedure Date  . Cesarean section    Family History: family history is not on file. Social History:  reports that she has never smoked. She has never used smokeless tobacco. She reports that she does not drink alcohol or use illicit drugs.  Review of Systems  Gastrointestinal: Positive for nausea. Negative for vomiting.  Psychiatric/Behavioral: The patient is nervous/anxious.   All other systems reviewed and are negative.  Contractions started this AM suddenly, intense/fast. Good FM, no LOF, + show  Dilation: 4 Effacement (%): 70 Station: -2 Exam by:: D. Coley, RNC Blood pressure 161/91, pulse 75, temperature 98.3 F (36.8 C), resp. rate 22, height 5\' 7"  (1.702 m), weight 89.812 kg (198 lb). Maternal Exam:  Uterine Assessment: Contraction strength is firm.  Contraction frequency is  regular.   Abdomen: Surgical scars: low transverse.   Fundal height is S=D.   Estimated fetal weight is 8.   Fetal presentation: vertex  Introitus: Normal vulva. Normal vagina.  Pelvis: adequate for delivery.   Cervix: Cervix evaluated by digital exam.     Fetal Exam Fetal Monitor Review: Mode: ultrasound.   Baseline rate: 130.  Variability: moderate (6-25 bpm).   Pattern: accelerations present and variable decelerations.    Fetal State Assessment: Category I - tracings are normal.     Physical Exam  Nursing note and vitals reviewed. Constitutional: She is oriented to person, place, and time. She appears well-developed and well-nourished.  HENT:  Head: Normocephalic.  Cardiovascular: Normal rate.   Respiratory: Effort normal and breath sounds normal.  GI: Soft. There is no tenderness.  Genitourinary: Vagina normal.  Musculoskeletal: Normal range of motion. She exhibits no edema.  Neurological: She is alert and oriented to person, place, and time.  Psychiatric:       Very anxious and crying, transitioning through labor quickly    Prenatal labs: ABO, Rh: A/Negative/-- (02/07 0000) Antibody: Negative (02/07 0000) Rubella:  immune RPR: Nonreactive (02/07 0000)  HBsAg: Negative (02/07 0000)  HIV: Non-reactive (02/07 0000)  GBS: Negative (08/20 0000)  1 hr GTT wnl [place]: nl anatomy Genetic screen: DSR increased on ultrascreen , nl sono with MFM, AFP1 wnl.   Assessment/Plan: IUP at term, TOLAC with  Hx of  successful VBAC x 1 impending birth Dr. Billy Coast paged for delivery, CNM on stand by in room  [facility] 09/20/2010, 10:44 AM

## 2010-09-21 LAB — CBC
HCT: 30.7 % — ABNORMAL LOW (ref 36.0–46.0)
Hemoglobin: 10.2 g/dL — ABNORMAL LOW (ref 12.0–15.0)
MCH: 30.6 pg (ref 26.0–34.0)
MCV: 92.2 fL (ref 78.0–100.0)
Platelets: 131 10*3/uL — ABNORMAL LOW (ref 150–400)
RBC: 3.33 MIL/uL — ABNORMAL LOW (ref 3.87–5.11)
WBC: 13.6 10*3/uL — ABNORMAL HIGH (ref 4.0–10.5)

## 2010-09-21 MED ORDER — IBUPROFEN 600 MG PO TABS: 600.0000 mg | ORAL_TABLET | Freq: Four times a day (QID) | ORAL | Status: AC | PRN

## 2010-09-21 NOTE — Discharge Summary (Signed)
Obstetric Discharge Summary Reason for Admission: onset of labor Prenatal Procedures: none Intrapartum Procedures: spontaneous vaginal delivery and VBAC Postpartum Procedures: none Complications-Operative and Postpartum: 1 degree perineal laceration Hemoglobin  Date Value Range Status  09/21/2010 10.2* 12.0-15.0 (g/dL) Final     REPEATED TO VERIFY     DELTA CHECK NOTED     HCT  Date Value Range Status  09/21/2010 30.7* 36.0-46.0 (%) Final    Discharge Diagnoses: Term Pregnancy-delivered / successful VBAC  Discharge Information: Date: 09/21/2010 Activity: pelvic rest Diet: routine Medications: PNV and Ibuprophen Condition: stable Instructions: refer to practice specific booklet Discharge to: home Follow-up Information    Follow up with Lenoard Aden, MD. Make an appointment in 6 weeks.   Contact information:   [address] [place] [address] [phone]          Newborn Data: Live born female  Birth Weight: 8 lb 2.3 oz (3694 g) APGAR: 8, 9  Home with mother.  Helen Rubio 09/21/2010, 11:42 AM

## 2010-09-21 NOTE — Progress Notes (Signed)
  PPD 1 SVD / VBAC  S:  Reports feeling well - desires early discharge today             Tolerating po/ No nausea or vomiting             Bleeding is light             Pain controlled withprescription NSAID's including motrin             Up ad lib / ambulatory  Newborn breast feeding  / female   O:  A & O x 3 NAD             VS: Blood pressure 143/85, pulse 67, temperature 97.9 F (36.6 C), temperature source Oral, resp. rate 18, height 5\' 7"  (1.702 m), weight 89.812 kg (198 lb), unknown if currently breastfeeding.  LABS: Lab Results  Component Value Date   WBC 13.6* 09/21/2010   HGB 10.2* 09/21/2010   HCT 30.7* 09/21/2010   MCV 92.2 09/21/2010   PLT 131* 09/21/2010     Lungs: Clear and unlabored  Heart: regular rate and rhythm / no mumurs  Abdomen: soft, non-tender, non-distended              Fundus: firm, non-tender, U-2  Perineum: slight edema  Lochia: light  Extremities: trace edema, no calf pain or tenderness    A: PPD # 1 / VBAC   Doing well - stable status  P:  Routine post partum orders  Early discharge home today at 24 hours post-delivery  Helen Rubio 09/21/2010, 11:39 AM

## 2010-10-15 LAB — CBC
HCT: 33.8 — ABNORMAL LOW
HCT: 35.3 — ABNORMAL LOW
Hemoglobin: 12.4
MCHC: 35.1
MCHC: 35.5
MCV: 93.6
MCV: 94
Platelets: 149 — ABNORMAL LOW
RDW: 13.1
WBC: 16 — ABNORMAL HIGH

## 2010-10-18 LAB — RH IMMUNE GLOBULIN WORKUP (NOT WOMEN'S HOSP)

## 2012-02-07 ENCOUNTER — Other Ambulatory Visit: Payer: Self-pay

## 2013-01-06 IMAGING — US US OB DETAIL+14 WK
1 series · 14 of 28 positions shown · non-contrast
Comparison: none

[Series 1: us ob detail+14 wk · 0.14mm/px · 85 acquisitions, 14 frames shown]
[im 4/85]
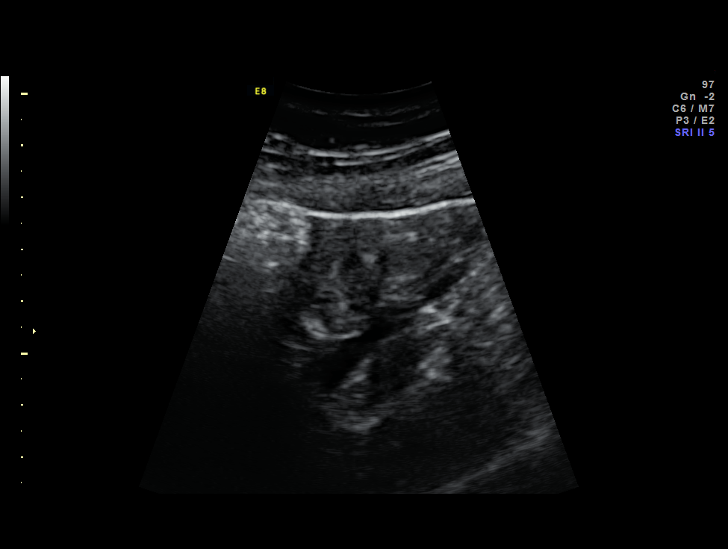
[im 10/85]
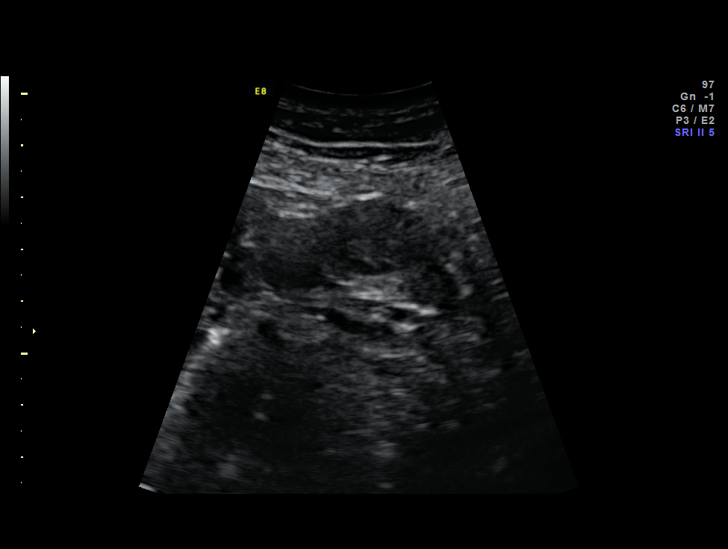
[im 16/85]
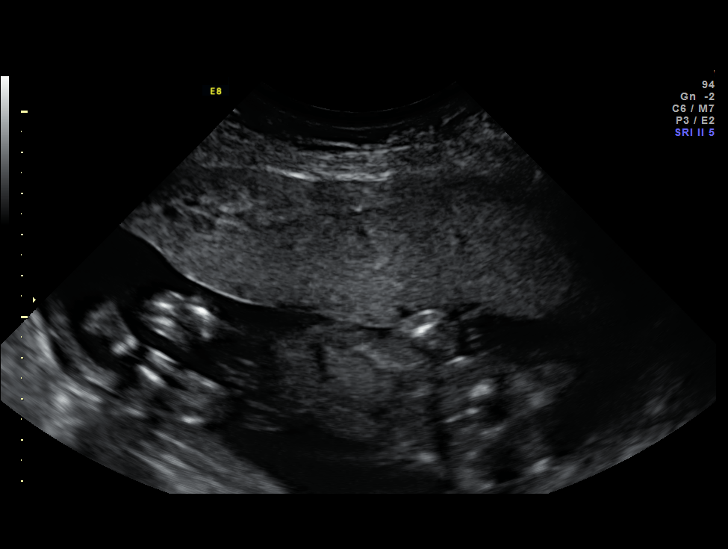
[im 22/85]
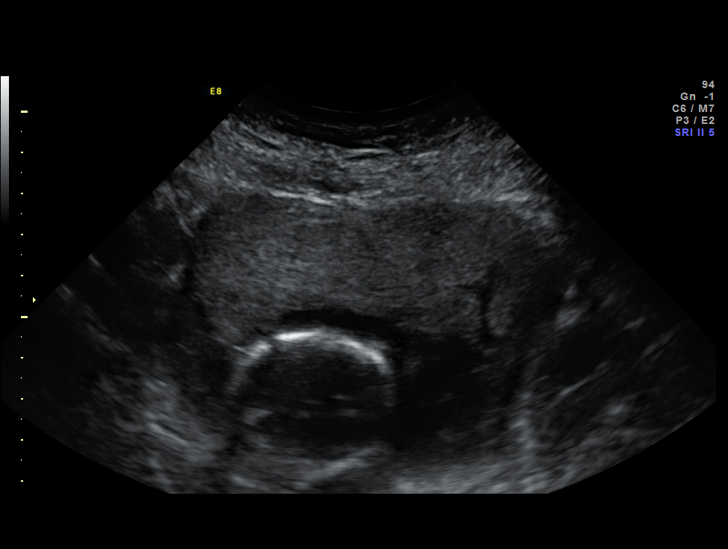
[im 29/85]
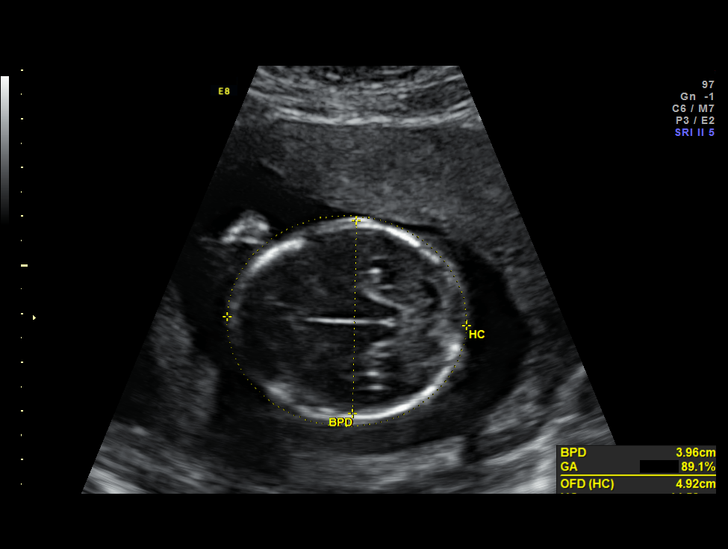
[im 35/85]
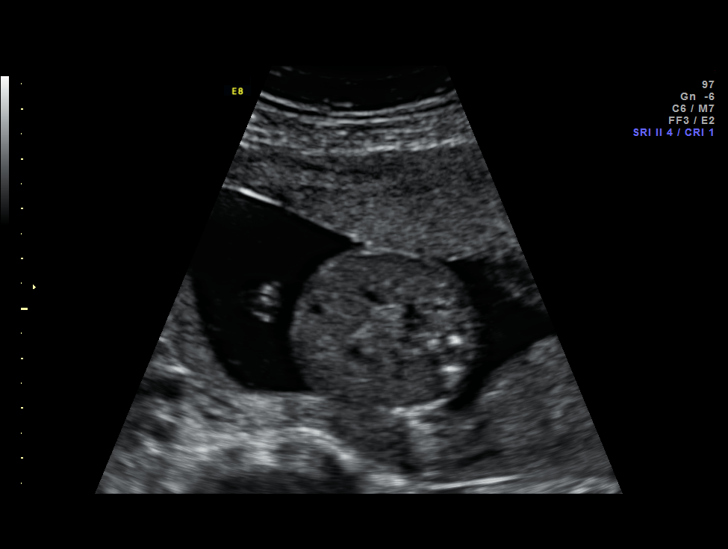
[im 41/85]
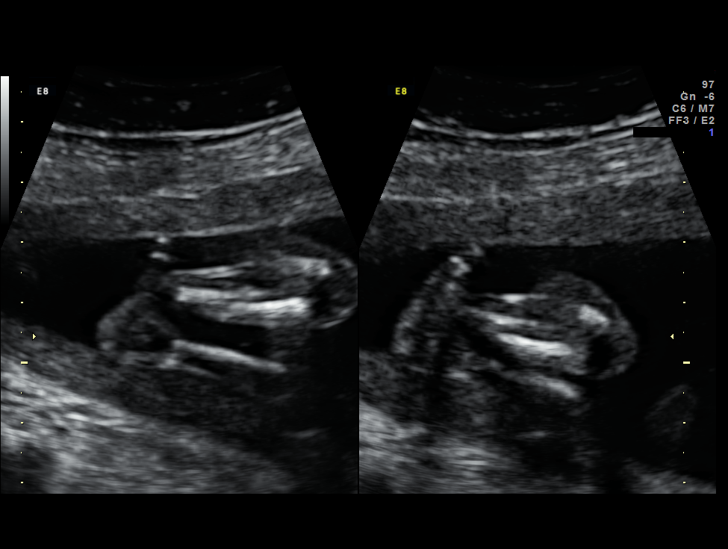
[im 47/85]
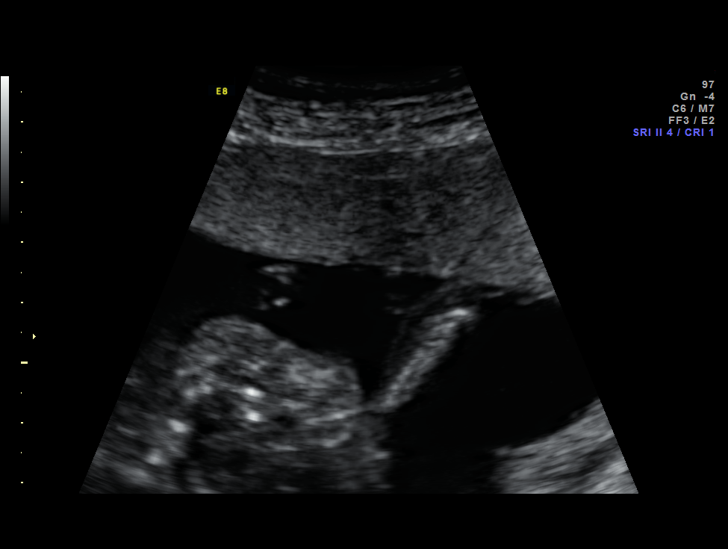
[im 53/85]
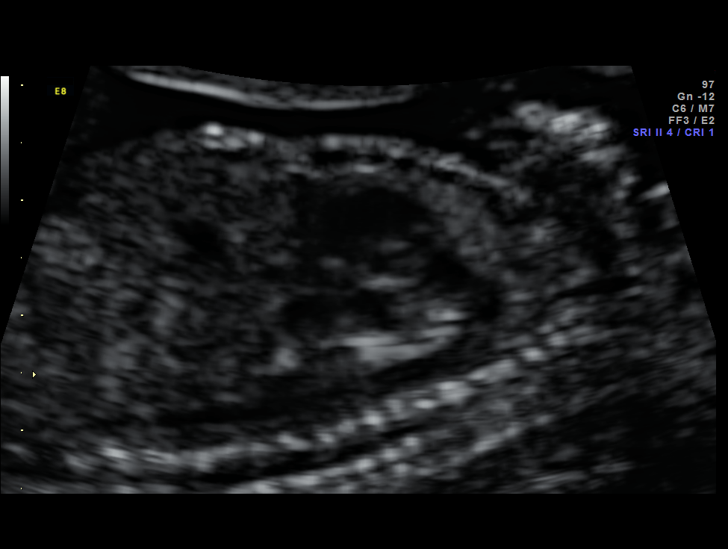
[im 60/85]
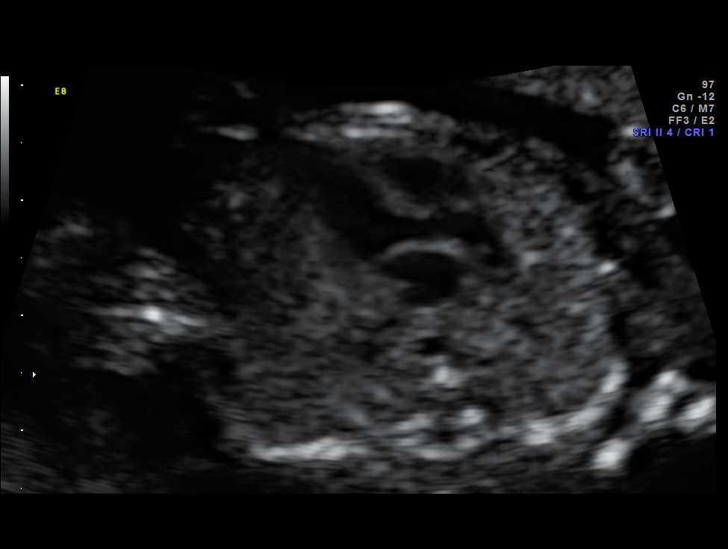
[im 66/85]
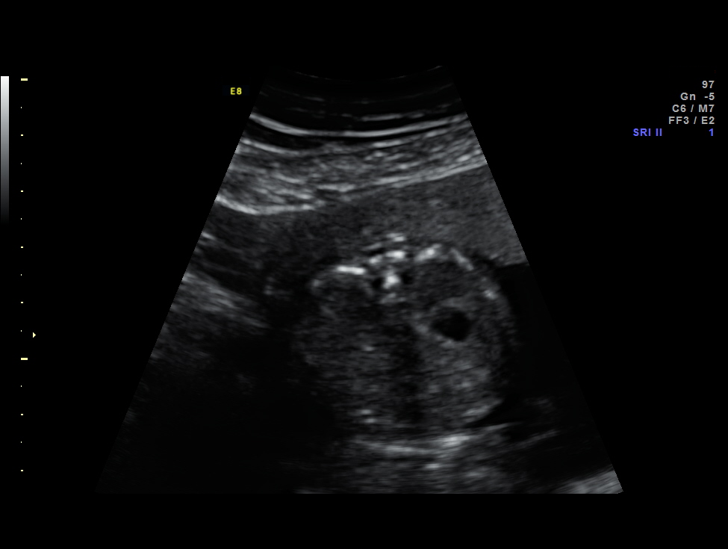
[im 72/85]
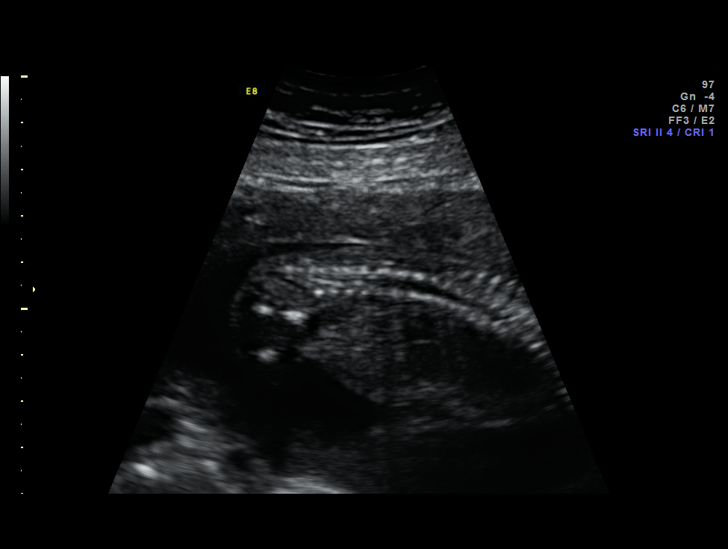
[im 78/85]
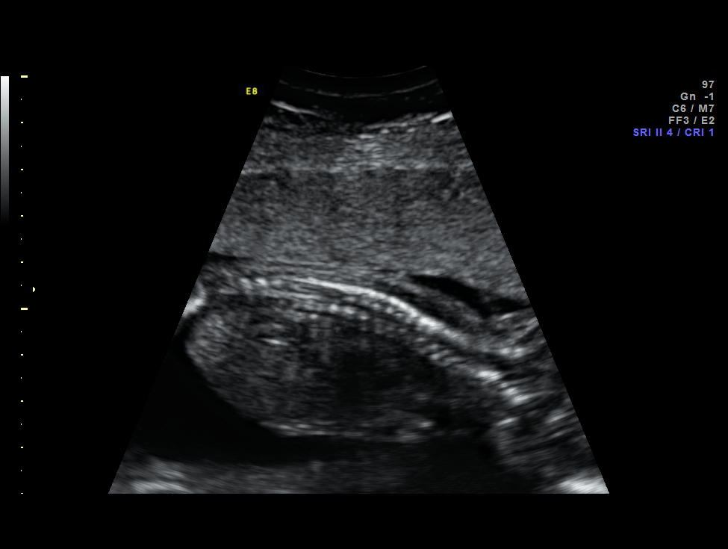
[im 85/85]
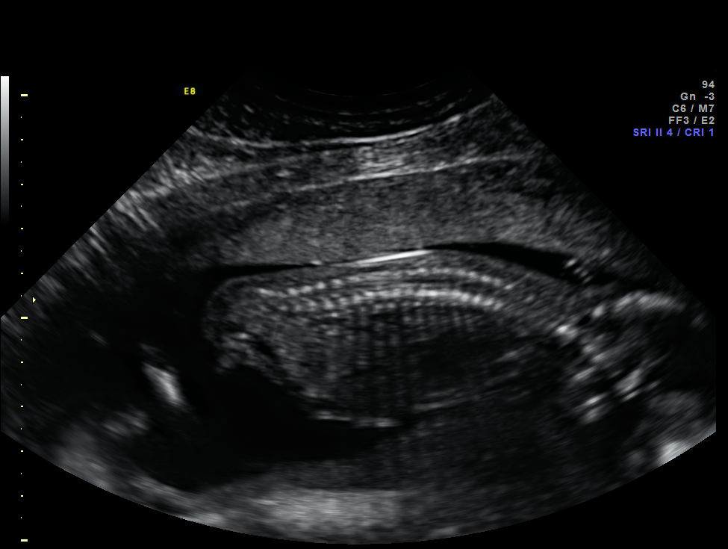

[14 of 28 positions shown; findings below may reference images not displayed]

Canned report from images found in remote index.

Refer to host system for actual result text.

## 2013-11-04 ENCOUNTER — Encounter (HOSPITAL_COMMUNITY): Payer: Self-pay | Admitting: *Deleted

## 2016-01-18 DIAGNOSIS — R5383 Other fatigue: Secondary | ICD-10-CM | POA: Diagnosis not present

## 2016-01-18 DIAGNOSIS — J0100 Acute maxillary sinusitis, unspecified: Secondary | ICD-10-CM | POA: Diagnosis not present

## 2016-01-19 DIAGNOSIS — H5201 Hypermetropia, right eye: Secondary | ICD-10-CM | POA: Diagnosis not present

## 2016-01-19 DIAGNOSIS — H524 Presbyopia: Secondary | ICD-10-CM | POA: Diagnosis not present

## 2016-02-04 DIAGNOSIS — Z1389 Encounter for screening for other disorder: Secondary | ICD-10-CM | POA: Diagnosis not present

## 2016-02-04 DIAGNOSIS — Z1231 Encounter for screening mammogram for malignant neoplasm of breast: Secondary | ICD-10-CM | POA: Diagnosis not present

## 2016-02-04 DIAGNOSIS — Z6824 Body mass index (BMI) 24.0-24.9, adult: Secondary | ICD-10-CM | POA: Diagnosis not present

## 2016-02-04 DIAGNOSIS — Z130 Encounter for screening for diseases of the blood and blood-forming organs and certain disorders involving the immune mechanism: Secondary | ICD-10-CM | POA: Diagnosis not present

## 2016-02-04 DIAGNOSIS — Z01419 Encounter for gynecological examination (general) (routine) without abnormal findings: Secondary | ICD-10-CM | POA: Diagnosis not present

## 2016-02-04 DIAGNOSIS — N920 Excessive and frequent menstruation with regular cycle: Secondary | ICD-10-CM | POA: Diagnosis not present

## 2016-04-15 DIAGNOSIS — R5383 Other fatigue: Secondary | ICD-10-CM | POA: Diagnosis not present

## 2016-04-15 DIAGNOSIS — E0590 Thyrotoxicosis, unspecified without thyrotoxic crisis or storm: Secondary | ICD-10-CM | POA: Diagnosis not present

## 2016-05-09 DIAGNOSIS — N920 Excessive and frequent menstruation with regular cycle: Secondary | ICD-10-CM | POA: Diagnosis not present

## 2016-06-02 DIAGNOSIS — E0590 Thyrotoxicosis, unspecified without thyrotoxic crisis or storm: Secondary | ICD-10-CM | POA: Diagnosis not present

## 2016-10-05 DIAGNOSIS — R5383 Other fatigue: Secondary | ICD-10-CM | POA: Diagnosis not present

## 2016-10-05 DIAGNOSIS — E0590 Thyrotoxicosis, unspecified without thyrotoxic crisis or storm: Secondary | ICD-10-CM | POA: Diagnosis not present

## 2016-10-11 DIAGNOSIS — E0590 Thyrotoxicosis, unspecified without thyrotoxic crisis or storm: Secondary | ICD-10-CM | POA: Diagnosis not present

## 2016-10-11 DIAGNOSIS — R5383 Other fatigue: Secondary | ICD-10-CM | POA: Diagnosis not present

## 2016-10-12 DIAGNOSIS — N926 Irregular menstruation, unspecified: Secondary | ICD-10-CM | POA: Diagnosis not present

## 2016-10-12 DIAGNOSIS — R5383 Other fatigue: Secondary | ICD-10-CM | POA: Diagnosis not present

## 2016-10-19 DIAGNOSIS — R5383 Other fatigue: Secondary | ICD-10-CM | POA: Diagnosis not present

## 2016-10-19 DIAGNOSIS — E0590 Thyrotoxicosis, unspecified without thyrotoxic crisis or storm: Secondary | ICD-10-CM | POA: Diagnosis not present

## 2016-10-25 DIAGNOSIS — N920 Excessive and frequent menstruation with regular cycle: Secondary | ICD-10-CM | POA: Diagnosis not present

## 2016-11-10 DIAGNOSIS — J0140 Acute pansinusitis, unspecified: Secondary | ICD-10-CM | POA: Diagnosis not present

## 2016-11-16 DIAGNOSIS — J0100 Acute maxillary sinusitis, unspecified: Secondary | ICD-10-CM | POA: Diagnosis not present

## 2016-12-23 DIAGNOSIS — R1084 Generalized abdominal pain: Secondary | ICD-10-CM | POA: Diagnosis not present

## 2016-12-23 DIAGNOSIS — E0590 Thyrotoxicosis, unspecified without thyrotoxic crisis or storm: Secondary | ICD-10-CM | POA: Diagnosis not present

## 2016-12-23 DIAGNOSIS — Z8639 Personal history of other endocrine, nutritional and metabolic disease: Secondary | ICD-10-CM | POA: Diagnosis not present

## 2017-01-05 DIAGNOSIS — R140 Abdominal distension (gaseous): Secondary | ICD-10-CM | POA: Diagnosis not present

## 2017-01-26 DIAGNOSIS — R51 Headache: Secondary | ICD-10-CM | POA: Diagnosis not present

## 2017-01-26 DIAGNOSIS — J3089 Other allergic rhinitis: Secondary | ICD-10-CM | POA: Diagnosis not present

## 2017-02-13 DIAGNOSIS — Z1231 Encounter for screening mammogram for malignant neoplasm of breast: Secondary | ICD-10-CM | POA: Diagnosis not present

## 2017-02-13 DIAGNOSIS — N946 Dysmenorrhea, unspecified: Secondary | ICD-10-CM | POA: Diagnosis not present

## 2017-02-13 DIAGNOSIS — Z01419 Encounter for gynecological examination (general) (routine) without abnormal findings: Secondary | ICD-10-CM | POA: Diagnosis not present

## 2017-05-01 DIAGNOSIS — M25521 Pain in right elbow: Secondary | ICD-10-CM | POA: Diagnosis not present

## 2017-05-01 DIAGNOSIS — M461 Sacroiliitis, not elsewhere classified: Secondary | ICD-10-CM | POA: Diagnosis not present

## 2017-05-09 DIAGNOSIS — M545 Low back pain: Secondary | ICD-10-CM | POA: Diagnosis not present

## 2017-06-08 DIAGNOSIS — J069 Acute upper respiratory infection, unspecified: Secondary | ICD-10-CM | POA: Diagnosis not present

## 2017-06-11 DIAGNOSIS — J018 Other acute sinusitis: Secondary | ICD-10-CM | POA: Diagnosis not present

## 2017-06-11 DIAGNOSIS — J3089 Other allergic rhinitis: Secondary | ICD-10-CM | POA: Diagnosis not present

## 2017-06-11 DIAGNOSIS — R05 Cough: Secondary | ICD-10-CM | POA: Diagnosis not present

## 2017-08-22 DIAGNOSIS — L237 Allergic contact dermatitis due to plants, except food: Secondary | ICD-10-CM | POA: Diagnosis not present

## 2017-08-22 DIAGNOSIS — R5383 Other fatigue: Secondary | ICD-10-CM | POA: Diagnosis not present

## 2017-10-10 DIAGNOSIS — R5383 Other fatigue: Secondary | ICD-10-CM | POA: Diagnosis not present

## 2017-10-10 DIAGNOSIS — E0590 Thyrotoxicosis, unspecified without thyrotoxic crisis or storm: Secondary | ICD-10-CM | POA: Diagnosis not present

## 2017-10-16 DIAGNOSIS — Z23 Encounter for immunization: Secondary | ICD-10-CM | POA: Diagnosis not present

## 2017-10-16 DIAGNOSIS — E0590 Thyrotoxicosis, unspecified without thyrotoxic crisis or storm: Secondary | ICD-10-CM | POA: Diagnosis not present

## 2017-10-16 DIAGNOSIS — E229 Hyperfunction of pituitary gland, unspecified: Secondary | ICD-10-CM | POA: Diagnosis not present

## 2017-10-18 DIAGNOSIS — E229 Hyperfunction of pituitary gland, unspecified: Secondary | ICD-10-CM | POA: Diagnosis not present

## 2017-11-02 DIAGNOSIS — E663 Overweight: Secondary | ICD-10-CM | POA: Diagnosis not present

## 2017-11-02 DIAGNOSIS — R5382 Chronic fatigue, unspecified: Secondary | ICD-10-CM | POA: Diagnosis not present

## 2017-11-06 DIAGNOSIS — E0590 Thyrotoxicosis, unspecified without thyrotoxic crisis or storm: Secondary | ICD-10-CM | POA: Diagnosis not present

## 2017-11-06 DIAGNOSIS — G43909 Migraine, unspecified, not intractable, without status migrainosus: Secondary | ICD-10-CM | POA: Diagnosis not present

## 2017-11-14 DIAGNOSIS — N915 Oligomenorrhea, unspecified: Secondary | ICD-10-CM | POA: Diagnosis not present

## 2017-11-14 DIAGNOSIS — E8881 Metabolic syndrome: Secondary | ICD-10-CM | POA: Diagnosis not present

## 2017-11-14 DIAGNOSIS — E559 Vitamin D deficiency, unspecified: Secondary | ICD-10-CM | POA: Diagnosis not present

## 2017-11-14 DIAGNOSIS — Z79899 Other long term (current) drug therapy: Secondary | ICD-10-CM | POA: Diagnosis not present

## 2017-11-14 DIAGNOSIS — R0602 Shortness of breath: Secondary | ICD-10-CM | POA: Diagnosis not present

## 2017-11-14 DIAGNOSIS — R635 Abnormal weight gain: Secondary | ICD-10-CM | POA: Diagnosis not present

## 2017-11-14 DIAGNOSIS — R5383 Other fatigue: Secondary | ICD-10-CM | POA: Diagnosis not present

## 2017-11-14 DIAGNOSIS — E7800 Pure hypercholesterolemia, unspecified: Secondary | ICD-10-CM | POA: Diagnosis not present

## 2017-11-16 DIAGNOSIS — E0590 Thyrotoxicosis, unspecified without thyrotoxic crisis or storm: Secondary | ICD-10-CM | POA: Diagnosis not present

## 2017-12-11 DIAGNOSIS — R632 Polyphagia: Secondary | ICD-10-CM | POA: Diagnosis not present

## 2017-12-11 DIAGNOSIS — R635 Abnormal weight gain: Secondary | ICD-10-CM | POA: Diagnosis not present

## 2017-12-11 DIAGNOSIS — N915 Oligomenorrhea, unspecified: Secondary | ICD-10-CM | POA: Diagnosis not present

## 2017-12-11 DIAGNOSIS — E559 Vitamin D deficiency, unspecified: Secondary | ICD-10-CM | POA: Diagnosis not present

## 2018-02-06 DIAGNOSIS — E0590 Thyrotoxicosis, unspecified without thyrotoxic crisis or storm: Secondary | ICD-10-CM | POA: Diagnosis not present

## 2018-02-06 DIAGNOSIS — Z79899 Other long term (current) drug therapy: Secondary | ICD-10-CM | POA: Diagnosis not present

## 2018-02-06 DIAGNOSIS — E559 Vitamin D deficiency, unspecified: Secondary | ICD-10-CM | POA: Diagnosis not present

## 2018-02-06 DIAGNOSIS — N915 Oligomenorrhea, unspecified: Secondary | ICD-10-CM | POA: Diagnosis not present

## 2018-02-06 DIAGNOSIS — R635 Abnormal weight gain: Secondary | ICD-10-CM | POA: Diagnosis not present

## 2018-03-07 DIAGNOSIS — R635 Abnormal weight gain: Secondary | ICD-10-CM | POA: Diagnosis not present

## 2018-03-07 DIAGNOSIS — E0590 Thyrotoxicosis, unspecified without thyrotoxic crisis or storm: Secondary | ICD-10-CM | POA: Diagnosis not present

## 2020-08-14 ENCOUNTER — Other Ambulatory Visit: Payer: Self-pay | Admitting: Family Medicine

## 2020-08-14 DIAGNOSIS — J0111 Acute recurrent frontal sinusitis: Secondary | ICD-10-CM

## 2020-09-29 ENCOUNTER — Ambulatory Visit
Admission: RE | Admit: 2020-09-29 | Discharge: 2020-09-29 | Disposition: A | Discharge status: Home / Self Care | Payer: 59 | Source: Ambulatory Visit | Attending: Family Medicine | Admitting: Family Medicine

## 2020-09-29 ENCOUNTER — Other Ambulatory Visit: Payer: Self-pay

## 2020-09-29 DIAGNOSIS — J0111 Acute recurrent frontal sinusitis: Secondary | ICD-10-CM

## 2020-12-04 ENCOUNTER — Ambulatory Visit: Payer: Self-pay

## 2021-02-17 ENCOUNTER — Encounter: Payer: Self-pay | Admitting: Neurology

## 2021-03-28 NOTE — Progress Notes (Signed)
? ?NEUROLOGY CONSULTATION NOTE ? ?Helen Rubio ?MRN: [id number] ?DOB: [birth year] ? ?Referring provider: Harlan Stains, MD ?Primary care provider: Harlan Stains, MD ? ?Reason for consult:  migraines ? ?Assessment/Plan:  ? ?Chronic migraine  ?Migraine with aura ? ?Plan to start Botox.  Patient has daily headaches for over 3 consecutive months.  Previously failed topiramate, beta blocker and tricyclic antidepressant. ?Stop ibuprofen.  Take sumatriptan 100mg  with Zofran ODT 4mg  at earliest onset of migraine. ?Limit use of pain relievers to no more than 2 days out of week to prevent risk of rebound or medication-overuse headache. ?Keep headache diary ?Get eye exam ?Follow up for Botox. ? ? ?Subjective:  ?Helen Rubio is a 51 year old right-handed female who presents for migraines.  History supplemented by primary care notes. ? ?Onset:  college.  They were 2 times a week.  Worse in last 6 months, almost daily. ?Location:  varies - 1.  forehead/behind eyes; 2.  back of neck up occiput ?Quality:  pressure ?Intensity:  1.  5/10; 2. 8/10.   ?Aura:  blurred vision/spots in left eye prior to headache ?Prodrome:  absent ?Associated symptoms:  1. Shoulder tension, photophobia, phonophobia; 2. Nausea, photophobia, phonophobia.  She denies associated unilateral numbness or weakness. ?Duration:  1. 5-6 days; 2. 1 day with rest/treatment ?Frequency:  1. Daily; 2. Twice a month ?Frequency of abortive medication: taking ibuprofen daily ?Triggers:  change in weather, smells (perfumes, cologne), skipped meals ?Relieving factors:  rest, Advil ?Activity:  severe aggravating ? ?Current NSAIDS/analgesics:  ibuprofen ?Current triptans:  sumatriptan 100mg  (tried once, ineffective) ?Current ergotamine:  none ?Current anti-emetic:  none ?Current muscle relaxants:  cyclobenzaprine (has not tried yet) ?Current Antihypertensive medications:  none ?Current Antidepressant medications:  bupropion ?Current Anticonvulsant medications:  none ?Current  anti-CGRP:  none ?Current Vitamins/Herbal/Supplements:  none ?Current Antihistamines/Decongestants:  none ?Other therapy:  physical therapy ?Hormone/birth control:  none ?Other medications:  Ambien PRN (only when she travels) ? ?Past NSAIDS/analgesics:  Cambia, Fioricet ?Past abortive triptans:  rizatriptan ?Past abortive ergotamine:  none ?Past muscle relaxants:  none ?Past anti-emetic:  none ?Past antihypertensive medications:  metoprolol ?Past antidepressant medications:  amitriptyline ?Past anticonvulsant medications:  topiramate ?Past anti-CGRP:  none ?Past vitamins/Herbal/Supplements:  none ?Past antihistamines/decongestants:  Flonase ?Other past therapies:  went to [facility] ? ?Caffeine:  1 large iced tea.  No coffee or soda ?Alcohol:  1 glass of red wine twice a week ?Smoker:  no ?Diet:  40 oz water daily.  No soda.  Tries to not skip meals. ?Exercise:  walking ?Depression:  a little bit; Anxiety:  a little bit.  Bupropion helps ?Other pain:  no ?Sleep hygiene:  tries to get 7 hours a night.  A light sleeper.  Chronic fatigue ?Family history of headache:  mom (migraines) ? ?  ? ? ?PAST MEDICAL HISTORY: ?Past Medical History:  ?Diagnosis Date  ? Headache(784.0)   ? Hyperthyroidism   ? Urinary tract infection   ? VBAC (vaginal birth after Cesarean) 9(/17) 09/20/2010  ? ? ?PAST SURGICAL HISTORY: ?Past Surgical History:  ?Procedure Laterality Date  ? CESAREAN SECTION    ? ? ?MEDICATIONS: ?Current Outpatient Medications on File Prior to Visit  ?Medication Sig Dispense Refill  ? butalbital-acetaminophen-caffeine (FIORICET, ESGIC) 50-325-40 MG per tablet Take 1 tablet by mouth 2 (two) times daily as needed. miagraine     ? calcium carbonate (TUMS - DOSED IN MG ELEMENTAL CALCIUM) 500 MG chewable tablet Chew 2 tablets by mouth 2 (two) times daily. Heartburn ?     ?  prenatal vitamin w/FE, FA (PRENATAL 1 + 1) 27-1 MG TABS Take 1 tablet by mouth daily.      ? ?No current facility-administered medications on  file prior to visit.  ? ? ?ALLERGIES: ?Allergies  ?Allergen Reactions  ? Sulfa Antibiotics Hives  ? ? ?FAMILY HISTORY: ?No family history on file. ? ?Objective:  ?Blood pressure 124/71, pulse 77, height 5\' 7"  (1.702 m), weight 160 lb 6.4 oz (72.8 kg), SpO2 95 %, unknown if currently breastfeeding. ?General: No acute distress.  Patient appears well-groomed.   ?Head:  Normocephalic/atraumatic ?Eyes:  fundi examined but not visualized ?Neck: supple, no paraspinal tenderness, full range of motion ?Back: No paraspinal tenderness ?Heart: regular rate and rhythm ?Lungs: Clear to auscultation bilaterally. ?Vascular: No carotid bruits. ?Neurological Exam: ?Mental status: alert and oriented to person, place, and time, recent and remote memory intact, fund of knowledge intact, attention and concentration intact, speech fluent and not dysarthric, language intact. ?Cranial nerves: ?CN I: not tested ?CN II: pupils equal, round and reactive to light, visual fields intact ?CN III, IV, VI:  full range of motion, no nystagmus, no ptosis ?CN V: facial sensation intact. ?CN VII: upper and lower face symmetric ?CN VIII: hearing intact ?CN IX, X: gag intact, uvula midline ?CN XI: sternocleidomastoid and trapezius muscles intact ?CN XII: tongue midline ?Bulk & Tone: normal, no fasciculations. ?Motor:  muscle strength 5/5 throughout ?Sensation:  Temperature and vibratory sensation intact. ?Deep Tendon Reflexes:  2+ throughout,  toes downgoing.   ?Finger to nose testing:  Without dysmetria.   ?Heel to shin:  Without dysmetria.   ?Gait:  Normal station and stride.  Romberg negative. ? ? ? ?Thank you for allowing me to take part in the care of this patient. ? ?Metta Clines, DO ? ?CC: Harlan Stains, MD ? ? ? ? ?

## 2021-03-29 ENCOUNTER — Other Ambulatory Visit: Payer: Self-pay

## 2021-03-29 ENCOUNTER — Ambulatory Visit (INDEPENDENT_AMBULATORY_CARE_PROVIDER_SITE_OTHER): Payer: 59 | Admitting: Neurology

## 2021-03-29 ENCOUNTER — Encounter: Payer: Self-pay | Admitting: Neurology

## 2021-03-29 VITALS — BP 124/71 | HR 77 | Ht 67.0 in | Wt 160.4 lb

## 2021-03-29 DIAGNOSIS — G43709 Chronic migraine without aura, not intractable, without status migrainosus: Secondary | ICD-10-CM

## 2021-03-29 DIAGNOSIS — G43109 Migraine with aura, not intractable, without status migrainosus: Secondary | ICD-10-CM | POA: Diagnosis not present

## 2021-03-29 MED ORDER — ONDANSETRON 4 MG PO TBDP: 4.0000 mg | ORAL_TABLET | Freq: Three times a day (TID) | ORAL | 5 refills | Status: DC | PRN

## 2021-03-29 NOTE — Patient Instructions (Addendum)
?  Plan to start Botox every 3 months ?Stop ibuprofen ?Take sumatriptan 100mg  at earliest onset of headache.  May repeat dose once in 2 hours if needed.  Maximum 2 tablets in 24 hours.   ?Take ondansetron for nausea at earliest onset of migraine as well ?May use cyclobenzaprine for neck pain flare - caution for drowsiness ?Limit use of pain relievers to no more than 2 days out of the week.  These medications include acetaminophen, NSAIDs (ibuprofen/Advil/Motrin, naproxen/Aleve, triptans (Imitrex/sumatriptan), Excedrin, and narcotics.  This will help reduce risk of rebound headaches. ?Be aware of common food triggers: ? - Caffeine:  coffee, black tea, cola, Mt. Dew ? - Chocolate ? - Dairy:  aged cheeses (brie, blue, cheddar, gouda, [place], provolone, [place], Swiss, etc), chocolate milk, buttermilk, sour cream, limit eggs and yogurt ? - Nuts, peanut butter ? - Alcohol ? - Cereals/grains:  FRESH breads (fresh bagels, sourdough, doughnuts), yeast productions ? - Processed/canned/aged/cured meats (pre-packaged deli meats, hotdogs) ? - MSG/glutamate:  soy sauce, flavor enhancer, pickled/preserved/marinated foods ? - Sweeteners:  aspartame (Equal, Nutrasweet).  Sugar and Splenda are okay ? - Vegetables:  legumes (lima beans, lentils, snow peas, fava beans, pinto peans, peas, garbanzo beans), sauerkraut, onions, olives, pickles ? - Fruit:  avocados, bananas, citrus fruit (orange, lemon, grapefruit), mango ? - Other:  Frozen meals, macaroni and cheese ?Routine exercise ?Stay adequately hydrated (aim for 64 oz water daily) ?Keep headache diary ?Maintain proper stress management ?Maintain proper sleep hygiene ?Do not skip meals ?Consider supplements:  magnesium citrate 400mg  daily, riboflavin 400mg  daily, coenzyme Q10 100mg  three times daily. ?Get routine eye exam. ? ?

## 2021-03-30 ENCOUNTER — Telehealth: Payer: Self-pay | Admitting: Pharmacy Technician

## 2021-03-30 ENCOUNTER — Other Ambulatory Visit (HOSPITAL_COMMUNITY): Payer: Self-pay

## 2021-03-30 NOTE — Telephone Encounter (Signed)
Patient Advocate Encounter ? ?Received notification from [facility] OFFICE that prior authorization for BOTOX 200U is required. ?  ?PA submitted on 3.28.23 ?Key [id number] ?Status is pending ?  ?[facility] will continue to follow ? ?Rashada Klontz R Isley Weisheit, CPhT ?Patient Advocate ?Phone: [phone] ?Fax:  [phone] ? ?

## 2021-04-01 ENCOUNTER — Other Ambulatory Visit (HOSPITAL_COMMUNITY): Payer: Self-pay

## 2021-04-01 NOTE — Telephone Encounter (Signed)
Patient Advocate Encounter ? ?Prior Authorization for Botox 200units has been approved.   ? ?PA# [id number] ? ?Effective dates: 03/31/21 through 10/01/21 ? ?Per Test Claim Patients co-pay is $1,228.84.  ? ?Spoke with Pharmacy to Process. ? ?Patient Advocate ?Fax: [phone]  ?

## 2021-04-07 ENCOUNTER — Telehealth: Payer: Self-pay | Admitting: Neurology

## 2021-04-07 NOTE — Telephone Encounter (Signed)
Pt called an given the information for the botox savings program so that she will not have to pay a high copay. Pt was very thankful , she said she will start working on that, she said that the specialty pharmacy is not listed on her letter she got, pt advised that we are still waiting on her letter and that we will get her botox ordered once we get that letter and know where to order from. ?

## 2021-04-07 NOTE — Telephone Encounter (Signed)
Pt called in stating she got a letter that she was approved for Botox. She would like to find out what the next steps are? ?

## 2021-04-15 ENCOUNTER — Telehealth: Payer: Self-pay

## 2021-04-15 DIAGNOSIS — G43709 Chronic migraine without aura, not intractable, without status migrainosus: Secondary | ICD-10-CM

## 2021-04-15 NOTE — Telephone Encounter (Signed)
LMOVM for patient, Please call the office back with update. ?See last ov notes 04/07/21. ?

## 2021-04-20 NOTE — Telephone Encounter (Signed)
Patient left a message returning [facility] call. ?

## 2021-04-21 MED ORDER — BOTOX 200 UNITS IJ SOLR: INTRAMUSCULAR | 4 refills | Status: DC

## 2021-04-21 NOTE — Telephone Encounter (Signed)
Telephone call to pt to see if she started the process of looking up her Speciality pharmacy and Botox savings. ? ?Per pt she was confused on what to do. ? ?Explained to pt the approval letter received did not have a pharmacy list so we can send the script for Botox. ? ?Advised patient it looks like she has [facility] and they do not accept Botox any more. So I will send her script and approval to [facility]. ? ?Patient to call and set up account and give them the Botox savings information. ?If she has started the Botox savings process please do that first. ? ? ?Pt agreed and will start that today and send me a mychart message advising of this. ? ?Script and approval letter sent to [facility] Via Epic.  ?

## 2021-04-21 NOTE — Telephone Encounter (Signed)
Patient returned call to Sheena. 

## 2021-04-23 ENCOUNTER — Telehealth: Payer: Self-pay | Admitting: Neurology

## 2021-04-23 ENCOUNTER — Encounter: Payer: Self-pay | Admitting: Neurology

## 2021-04-23 NOTE — Telephone Encounter (Signed)
Message sent to Annabelle Harman to call patient to schedule for May 19th.  ?

## 2021-04-23 NOTE — Telephone Encounter (Signed)
Patient called and said she will be using [facility] for her Botox. ? ?Patient stated she is ready to schedule her Botox. Okay to proceed with scheduling? ?

## 2021-05-18 ENCOUNTER — Ambulatory Visit: Payer: 59 | Admitting: Neurology

## 2021-05-21 ENCOUNTER — Ambulatory Visit (INDEPENDENT_AMBULATORY_CARE_PROVIDER_SITE_OTHER): Payer: 59 | Admitting: Neurology

## 2021-05-21 DIAGNOSIS — G43709 Chronic migraine without aura, not intractable, without status migrainosus: Secondary | ICD-10-CM | POA: Diagnosis not present

## 2021-05-21 MED ORDER — ONABOTULINUMTOXINA 100 UNITS IJ SOLR
200.0000 [IU] | Freq: Once | INTRAMUSCULAR | Status: AC
  Administered 2021-05-21: 155 [IU] via INTRAMUSCULAR

## 2021-05-21 NOTE — Progress Notes (Signed)
Botulinum Clinic  ° °Procedure Note Botox ° °Attending: Dr. Dillian Feig ° °Preoperative Diagnosis(es): Chronic migraine ° °Consent obtained from: The patient °Benefits discussed included, but were not limited to decreased muscle tightness, increased joint range of motion, and decreased pain.  Risk discussed included, but were not limited pain and discomfort, bleeding, bruising, excessive weakness, venous thrombosis, muscle atrophy and dysphagia.  Anticipated outcomes of the procedure as well as he risks and benefits of the alternatives to the procedure, and the roles and tasks of the personnel to be involved, were discussed with the patient, and the patient consents to the procedure and agrees to proceed. A copy of the patient medication guide was given to the patient which explains the blackbox warning. ° °Patients identity and treatment sites confirmed Yes.  . ° °Details of Procedure: °Skin was cleaned with alcohol. Prior to injection, the needle plunger was aspirated to make sure the needle was not within a blood vessel.  There was no blood retrieved on aspiration.   ° °Following is a summary of the muscles injected  And the amount of Botulinum toxin used: ° °Dilution °200 units of Botox was reconstituted with 4 ml of preservative free normal saline. °Time of reconstitution: At the time of the office visit (<30 minutes prior to injection)  ° °Injections  °155 total units of Botox was injected with a 30 gauge needle. ° °Injection Sites: °L occipitalis: 15 units- 3 sites  °R occiptalis: 15 units- 3 sites ° °L upper trapezius: 15 units- 3 sites °R upper trapezius: 15 units- 3 sits          °L paraspinal: 10 units- 2 sites °R paraspinal: 10 units- 2 sites ° °Face °L frontalis(2 injection sites):10 units   °R frontalis(2 injection sites):10 units         °L corrugator: 5 units   °R corrugator: 5 units           °Procerus: 5 units   °L temporalis: 20 units °R temporalis: 20 units  ° °Agent:  °200 units of botulinum Type  A (Onobotulinum Toxin type A) was reconstituted with 4 ml of preservative free normal saline.  °Time of reconstitution: At the time of the office visit (<30 minutes prior to injection)  ° ° ° Total injected (Units):  155 ° Total wasted (Units):  45 ° °Patient tolerated procedure well without complications.   °Reinjection is anticipated in 3 months. ° ° °

## 2021-08-20 ENCOUNTER — Ambulatory Visit: Payer: 59 | Admitting: Neurology

## 2021-09-07 ENCOUNTER — Telehealth (HOSPITAL_COMMUNITY): Payer: Self-pay | Admitting: Pharmacy Technician

## 2021-09-07 NOTE — Telephone Encounter (Signed)
Patient Advocate Encounter   Received notification that prior authorization for Botox 200UNIT solution is required.   PA submitted on 09/07/2021 Key [id number] Status is pending       Roland Earl, CPhT Pharmacy Patient Advocate Specialist [facility] Patient Advocate Team Direct Number: [phone]  Fax: [phone]

## 2021-09-08 ENCOUNTER — Other Ambulatory Visit (HOSPITAL_COMMUNITY): Payer: Self-pay

## 2021-09-08 NOTE — Telephone Encounter (Signed)
Patient Advocate Encounter  Prior Authorization for Botox 200UNIT solution  has been approved.    PA# [id number] Effective dates: 09/08/2021 through 09/08/2022  Can be filled at [facility]   Roland Earl, CPhT Pharmacy Patient Advocate Specialist [facility] Patient Advocate Team Direct Number: [phone]  Fax: [phone]

## 2021-09-17 ENCOUNTER — Encounter: Payer: Self-pay | Admitting: Neurology

## 2021-09-17 ENCOUNTER — Telehealth: Payer: Self-pay | Admitting: Neurology

## 2021-09-17 ENCOUNTER — Ambulatory Visit (INDEPENDENT_AMBULATORY_CARE_PROVIDER_SITE_OTHER): Payer: 59 | Admitting: Neurology

## 2021-09-17 DIAGNOSIS — G43709 Chronic migraine without aura, not intractable, without status migrainosus: Secondary | ICD-10-CM | POA: Diagnosis not present

## 2021-09-17 MED ORDER — ONABOTULINUMTOXINA 100 UNITS IJ SOLR
200.0000 [IU] | Freq: Once | INTRAMUSCULAR | Status: AC
  Administered 2021-09-17: 155 [IU] via INTRAMUSCULAR

## 2021-09-17 NOTE — Telephone Encounter (Signed)
Sumatriptan is causing palpitations.  Will have her try samples of Nurtec.

## 2021-09-17 NOTE — Progress Notes (Signed)
Medication Samples have been provided to the patient.  Drug name: nurtec       Strength: 75mg         Qty: 2 box  LOT:  Exp.Date: 8/25  Dosing instructions: 1 tab in 24 hours at onset of headache  The patient has been instructed regarding the correct time, dose, and frequency of taking this medication, including desired effects and most common side effects.

## 2021-09-17 NOTE — Progress Notes (Signed)
Botulinum Clinic  ° °Procedure Note Botox ° °Attending: Dr. Ford Peddie ° °Preoperative Diagnosis(es): Chronic migraine ° °Consent obtained from: The patient °Benefits discussed included, but were not limited to decreased muscle tightness, increased joint range of motion, and decreased pain.  Risk discussed included, but were not limited pain and discomfort, bleeding, bruising, excessive weakness, venous thrombosis, muscle atrophy and dysphagia.  Anticipated outcomes of the procedure as well as he risks and benefits of the alternatives to the procedure, and the roles and tasks of the personnel to be involved, were discussed with the patient, and the patient consents to the procedure and agrees to proceed. A copy of the patient medication guide was given to the patient which explains the blackbox warning. ° °Patients identity and treatment sites confirmed Yes.  . ° °Details of Procedure: °Skin was cleaned with alcohol. Prior to injection, the needle plunger was aspirated to make sure the needle was not within a blood vessel.  There was no blood retrieved on aspiration.   ° °Following is a summary of the muscles injected  And the amount of Botulinum toxin used: ° °Dilution °200 units of Botox was reconstituted with 4 ml of preservative free normal saline. °Time of reconstitution: At the time of the office visit (<30 minutes prior to injection)  ° °Injections  °155 total units of Botox was injected with a 30 gauge needle. ° °Injection Sites: °L occipitalis: 15 units- 3 sites  °R occiptalis: 15 units- 3 sites ° °L upper trapezius: 15 units- 3 sites °R upper trapezius: 15 units- 3 sits          °L paraspinal: 10 units- 2 sites °R paraspinal: 10 units- 2 sites ° °Face °L frontalis(2 injection sites):10 units   °R frontalis(2 injection sites):10 units         °L corrugator: 5 units   °R corrugator: 5 units           °Procerus: 5 units   °L temporalis: 20 units °R temporalis: 20 units  ° °Agent:  °200 units of botulinum Type  A (Onobotulinum Toxin type A) was reconstituted with 4 ml of preservative free normal saline.  °Time of reconstitution: At the time of the office visit (<30 minutes prior to injection)  ° ° ° Total injected (Units):  155 ° Total wasted (Units):  45 ° °Patient tolerated procedure well without complications.   °Reinjection is anticipated in 3 months. ° ° °

## 2021-10-20 NOTE — Progress Notes (Signed)
NEUROLOGY FOLLOW UP OFFICE NOTE  Helen Rubio [id number]  Assessment/Plan:   Chronic migraine without aura, without status migrainosus, not intractable - cervicogenic component Migraine with aura Cervicalgia   Continue Botox as she has had improvement in regards to severity and frequency.  Will add nortriptyline 10mg  at bedtime.  We can increase to 25mg  at bedtime in 4 weeks if needed. Refer to physical therapy for neck pain She will try Nurtec.  Zofran ODT 4mg  at earliest onset of migraine. Limit use of pain relievers to no more than 2 days out of week to prevent risk of rebound or medication-overuse headache. Keep headache diary Follow up in 6 months.     Subjective:  Helen Rubio is a 51 year old right-handed female who follows up for migraines.  UPDATE: Started Botox.  Status post 2 rounds. Intensity:  1. 5/10; 2. 3-4/10 Duration:  1. 3-4 days; 2. 1 day Frequency:  1. 15 days, 2. Twice a month Side effects to sumatriptan.  Hasn't tried Nurtec yet.   Reports ongoing daily right sided neck pain into shoulder.   Current NSAIDS/analgesics:  ibuprofen Current triptans:  none Current ergotamine:  none Current anti-emetic:  Zofran ODT 4mg  Current muscle relaxants:  cyclobenzaprine (has not tried yet) Current Antihypertensive medications:  none Current Antidepressant medications:  bupropion Current Anticonvulsant medications:  none Current anti-CGRP:  Nurtec (has samples - has not tried it yet) Current Vitamins/Herbal/Supplements:  none Current Antihistamines/Decongestants:  none Other therapy:  none Hormone/birth control:  none Other medications:  Ambien PRN (only when she travels)  Caffeine:  1 large iced tea.  No coffee or soda Alcohol:  1 glass of red wine twice a week Smoker:  no Diet:  40 oz water daily.  No soda.  Tries to not skip meals. Exercise:  walking Depression:  a little bit; Anxiety:  a little bit.  Bupropion helps Other pain:  neck pain as  above. Sleep hygiene:  tries to get 7 hours a night.  A light sleeper.  Chronic fatigue  HISTORY:  Onset:  college.  They were 2 times a week.  Worse in last 6 months, almost daily. Location:  varies - 1.  forehead/behind eyes; 2.  back of neck up occiput Quality:  pressure Intensity:  1.  5/10; 2. 8/10.   Aura:  blurred vision/spots in left eye prior to headache Prodrome:  absent Associated symptoms:  1. Shoulder tension, photophobia, phonophobia; 2. Nausea, photophobia, phonophobia.  She denies associated unilateral numbness or weakness. Duration:  1. 5-6 days; 2. 1 day with rest/treatment Frequency:  1. Daily; 2. Twice a month Frequency of abortive medication: taking ibuprofen daily Triggers:  change in weather, smells (perfumes, cologne), skipped meals Relieving factors:  rest, Advil Activity:  severe aggravating    Past NSAIDS/analgesics:  Cambia, Fioricet Past abortive triptans:  rizatriptan, sumatriptan (caused palpitations) Past abortive ergotamine:  none Past muscle relaxants:  none Past anti-emetic:  none Past antihypertensive medications:  metoprolol Past antidepressant medications:  amitriptyline Past anticonvulsant medications:  topiramate Past anti-CGRP:  sumatriptan (side effects) Past vitamins/Herbal/Supplements:  none Past antihistamines/decongestants:  Flonase Other past therapies:  went to [facility]    Family history of headache:  mom (migraines)  PAST MEDICAL HISTORY: Past Medical History:  Diagnosis Date   Headache(784.0)    Hyperthyroidism    Urinary tract infection    VBAC (vaginal birth after Cesarean) 9(/17) 09/20/2010    MEDICATIONS: Current Outpatient Medications on File Prior to Visit  Medication Sig Dispense  Refill   Botulinum Toxin Type A (BOTOX) 200 units SOLR Inject 155 units IM into multiple site in the face,neck and head once every 90 days 1 each 4   buPROPion (WELLBUTRIN XL) 300 MG 24 hr tablet Take 300 mg by mouth every  morning.     butalbital-acetaminophen-caffeine (FIORICET, ESGIC) 50-325-40 MG per tablet Take 1 tablet by mouth 2 (two) times daily as needed. miagraine  (Patient not taking: Reported on 03/29/2021)     calcium carbonate (TUMS - DOSED IN MG ELEMENTAL CALCIUM) 500 MG chewable tablet Chew 2 tablets by mouth 2 (two) times daily. Heartburn       cyclobenzaprine (FLEXERIL) 10 MG tablet Take 10 mg by mouth at bedtime as needed.     ondansetron (ZOFRAN-ODT) 4 MG disintegrating tablet Take 1 tablet (4 mg total) by mouth every 8 (eight) hours as needed for nausea or vomiting. 20 tablet 5   SUMAtriptan (IMITREX) 100 MG tablet 1 tablet as needed for headache, may repeat once in 2 hours if needed     zolpidem (AMBIEN) 5 MG tablet zolpidem 5 mg tablet  TAKE 1 TABLET BY MOUTH AT BEDTIME ONCE A DAY AS NEEDED FOR INSOMNIA     No current facility-administered medications on file prior to visit.    ALLERGIES: Allergies  Allergen Reactions   Sulfa Antibiotics Hives    FAMILY HISTORY: Family History  Problem Relation Age of Onset   Migraines Mother       Objective:  Blood pressure 135/81, pulse 88, height 5\' 6"  (1.676 m), weight 168 lb 12.8 oz (76.6 kg), SpO2 99 %, unknown if currently breastfeeding. General: No acute distress.  Patient appears well-groomed.    , DO  CC: Shon Millet, MD

## 2021-10-25 ENCOUNTER — Ambulatory Visit (INDEPENDENT_AMBULATORY_CARE_PROVIDER_SITE_OTHER): Payer: 59 | Admitting: Neurology

## 2021-10-25 ENCOUNTER — Encounter: Payer: Self-pay | Admitting: Neurology

## 2021-10-25 VITALS — BP 135/81 | HR 88 | Ht 66.0 in | Wt 168.8 lb

## 2021-10-25 DIAGNOSIS — G43709 Chronic migraine without aura, not intractable, without status migrainosus: Secondary | ICD-10-CM

## 2021-10-25 DIAGNOSIS — M542 Cervicalgia: Secondary | ICD-10-CM

## 2021-10-25 DIAGNOSIS — G43109 Migraine with aura, not intractable, without status migrainosus: Secondary | ICD-10-CM

## 2021-10-25 MED ORDER — NORTRIPTYLINE HCL 10 MG PO CAPS: 10.0000 mg | ORAL_CAPSULE | Freq: Every day | ORAL | 5 refills | Status: DC

## 2021-10-25 NOTE — Patient Instructions (Signed)
Start nortriptyline 10mg  at bedtime.  If no improvement in 4 weeks, contact me Physical therapy for neck pain Use Nurtec for acute migraine Continue Botox Follow up in 6 months.

## 2021-11-02 ENCOUNTER — Ambulatory Visit: Payer: 59

## 2021-11-09 ENCOUNTER — Ambulatory Visit: Payer: 59 | Attending: Neurology | Admitting: Physical Therapy

## 2021-11-09 DIAGNOSIS — M542 Cervicalgia: Secondary | ICD-10-CM | POA: Insufficient documentation

## 2021-11-09 NOTE — Therapy (Signed)
OUTPATIENT PHYSICAL THERAPY CERVICAL EVALUATION   Patient Name: Helen Rubio MRN: [id number] DOB:28-Sep-1970, 51 y.o., female Today's Date: 11/10/2021   PT End of Session - 11/10/21 1736     Visit Number 1    Number of Visits 9    Date for PT Re-Evaluation 12/17/21   extra week for delay in scheduling   Authorization Type [employer]    PT Start Time 1315    PT Stop Time 1400    PT Time Calculation (min) 45 min    Activity Tolerance Patient tolerated treatment well    Behavior During Therapy [facility] for tasks assessed/performed             Past Medical History:  Diagnosis Date   Headache(784.0)    Hyperthyroidism    Urinary tract infection    VBAC (vaginal birth after Cesarean) 9(/17) 09/20/2010   Past Surgical History:  Procedure Laterality Date   CESAREAN SECTION     Patient Active Problem List   Diagnosis Date Noted   VBAC (vaginal birth after Cesarean) 9(/17) 09/20/2010    PCP: Harlan Stains, MD  REFERRING PROVIDER: Pieter Partridge, DO  REFERRING DIAG: M54.2 (ICD-10-CM) - Cervicalgia  THERAPY DIAG:  Cervicalgia  Rationale for Evaluation and Treatment: Rehabilitation  ONSET DATE: Referral date 10-25-21  SUBJECTIVE:                                                                                                                                                                                                         SUBJECTIVE STATEMENT: Pt reports she has had migraine headaches for several years - has received Botox injections for the headaches from Dr. Tomi Likens. Pt reports cervical pain in Rt posterior neck and upper trap, radiating down into scapular region:   PERTINENT HISTORY:  Chronic migraine without aura, without status migrainosus, not intractable - cervicogenic component Migraine with aura Cervicalgia    PAIN:  Are you having pain? Yes: NPRS scale: 4/10 Pain location: Rt posterior and lateral cervical region Aggravating factors: stress Relieving factors:  stretch and regular massage Pt reports she has daily headaches; has migraine approx. Once a week  PRECAUTIONS: None  WEIGHT BEARING RESTRICTIONS: No  FALLS:  Has patient fallen in last 6 months? No  LIVING ENVIRONMENT: Lives with: lives with their family Lives in: House/apartment   PLOF: Independent  PATIENT GOALS: decrease neck pain   OBJECTIVE:   DIAGNOSTIC FINDINGS:  N/A  PATIENT SURVEYS:  NDI 24%  COGNITION: Overall cognitive status: Within functional limits for tasks assessed  SENSATION: WFL  POSTURE: No Significant  postural limitations  PALPATION: Trigger points palpated in Rt upper trap   CERVICAL ROM:   Active ROM A/PROM (deg) eval  Flexion WNL  Extension WNL  Right lateral flexion 40  Left lateral flexion 34  Right rotation 58  Left rotation 71   (Blank rows = not tested)  UPPER EXTREMITY ROM:  WNL's   UPPER EXTREMITY MMT:  WNL's   TREATMENT:  11-09-21  PTAccess Code: [email or web address] URL: [email or web address] Date: 11/10/2021 Prepared by: Maebelle Munroe  Exercises - Seated Cervical Sidebending Stretch  - 1 x daily - 7 x weekly - 1 sets - 2-3 reps - 15-20 secs hold - Seated Assisted Cervical Rotation with Towel  - 1 x daily - 7 x weekly - 1 sets - 2-3 reps - 15-20 secs  hold - Supine Passive Cervical Retraction  - 1 x daily - 7 x weekly - 1 sets - 5 reps - 5 secs hold   PATIENT EDUCATION:  Education details: see above for [employer] HEP Person educated: Patient Education method: Explanation, Demonstration, and Handouts Education comprehension: verbalized understanding and returned demonstration  HOME EXERCISE PROGRAM: See above  ASSESSMENT:  CLINICAL IMPRESSION: Patient is a 51 y.o. lady who was seen today for physical therapy evaluation and treatment for cervicalgia.  Pt rates neck pain 4/10, stating it is "more annoying than anything".  Pt has decreased Rt cervical rotation and Lt lateral flexion; trigger points palpated  in Rt upper trap.  Pt's score on neck pain & disability questionnaire is 24% with highest score rated on having frequent headaches.  Pt would benefit from PT including dry needling modality to reduce neck pain and increase cervical AROM.   OBJECTIVE IMPAIRMENTS: decreased ROM, pain, and headaches .   ACTIVITY LIMITATIONS:  neck pain - does not prevent any acitivities from being completed ; sleeping impacted at times  PARTICIPATION LIMITATIONS:  N/A  PERSONAL FACTORS: 1 comorbidity: migraines  are also affecting patient's functional outcome.   REHAB POTENTIAL: Good  CLINICAL DECISION MAKING: Stable/uncomplicated  EVALUATION COMPLEXITY: Low   GOALS: Goals reviewed with patient? Yes  LONG TERM GOALS: Target date: 12/15/2021  Improve Neck pain & disability questionnaire score to </= 14% to demo improvement in neck pain. Baseline: 24% Goal status: INITIAL  2.  Increase Rt cervical rotation to >/= 70 degrees and Lt lateral flexion to >/= 40 degrees. Baseline: Rotation 58 degrees;  Lt lateral flexion 34 degrees Goal status: INITIAL  3.  Independent in HEP for cervical ROM and strengthening. Baseline: To be established Goal status: INITIAL  4.  Pt will subjectively report cervical pain at rest to be </= 2/10 intensity.  Baseline: 4/10 Goal status: INITIAL    PLAN:  PT FREQUENCY: 2x/week  PT DURATION: 4 weeks  PLANNED INTERVENTIONS: Therapeutic exercises, Therapeutic activity, Neuromuscular re-education, Patient/Family education, Self Care, Joint mobilization, Dry Needling, Electrical stimulation, Cryotherapy, Moist heat, and Ultrasound  PLAN FOR NEXT SESSION: dry needling to Rt upper trap/posterior cervical region;  check HEP and add exercises as appropriate   Helen Rubio, Helen Rubio, PT 11/10/2021, 5:47 PM

## 2021-11-10 ENCOUNTER — Encounter: Payer: Self-pay | Admitting: Physical Therapy

## 2021-12-17 ENCOUNTER — Other Ambulatory Visit: Payer: Self-pay | Admitting: Neurology

## 2021-12-17 ENCOUNTER — Ambulatory Visit (INDEPENDENT_AMBULATORY_CARE_PROVIDER_SITE_OTHER): Payer: 59 | Admitting: Neurology

## 2021-12-17 DIAGNOSIS — G43709 Chronic migraine without aura, not intractable, without status migrainosus: Secondary | ICD-10-CM | POA: Diagnosis not present

## 2021-12-17 MED ORDER — NORTRIPTYLINE HCL 25 MG PO CAPS: 25.0000 mg | ORAL_CAPSULE | Freq: Every day | ORAL | 3 refills | Status: DC

## 2021-12-17 MED ORDER — ONABOTULINUMTOXINA 100 UNITS IJ SOLR
200.0000 [IU] | Freq: Once | INTRAMUSCULAR | Status: AC
  Administered 2021-12-17: 155 [IU] via INTRAMUSCULAR

## 2021-12-17 NOTE — Progress Notes (Signed)
Increase nortriptyline from 10mg  at bedtime to 25mg  at bedtime

## 2021-12-17 NOTE — Progress Notes (Signed)
[facility]  ° °Procedure Note Botox ° °Attending: Dr. Raniah Karan ° °Preoperative Diagnosis(es): Chronic migraine ° °Consent obtained from: The patient °Benefits discussed included, but were not limited to decreased muscle tightness, increased joint range of motion, and decreased pain.  Risk discussed included, but were not limited pain and discomfort, bleeding, bruising, excessive weakness, venous thrombosis, muscle atrophy and dysphagia.  Anticipated outcomes of the procedure as well as he risks and benefits of the alternatives to the procedure, and the roles and tasks of the personnel to be involved, were discussed with the patient, and the patient consents to the procedure and agrees to proceed. A copy of the patient medication guide was given to the patient which explains the blackbox warning. ° °Patients identity and treatment sites confirmed Yes.  . ° °Details of Procedure: °Skin was cleaned with alcohol. Prior to injection, the needle plunger was aspirated to make sure the needle was not within a blood vessel.  There was no blood retrieved on aspiration.   ° °Following is a summary of the muscles injected  And the amount of Botulinum toxin used: ° °Dilution °200 units of Botox was reconstituted with 4 ml of preservative free normal saline. °Time of reconstitution: At the time of the office visit (<30 minutes prior to injection)  ° °Injections  °155 total units of Botox was injected with a 30 gauge needle. ° °Injection Sites: °L occipitalis: 15 units- 3 sites  °R occiptalis: 15 units- 3 sites ° °L upper trapezius: 15 units- 3 sites °R upper trapezius: 15 units- 3 sits          °L paraspinal: 10 units- 2 sites °R paraspinal: 10 units- 2 sites ° °Face °L frontalis(2 injection sites):10 units   °R frontalis(2 injection sites):10 units         °L corrugator: 5 units   °R corrugator: 5 units           °Procerus: 5 units   °L temporalis: 20 units °R temporalis: 20 units  ° °Agent:  °200 units of botulinum Type  A (Onobotulinum Toxin type A) was reconstituted with 4 ml of preservative free normal saline.  °Time of reconstitution: At the time of the office visit (<30 minutes prior to injection)  ° ° ° Total injected (Units):  155 ° Total wasted (Units):  45 ° °Patient tolerated procedure well without complications.   °Reinjection is anticipated in 3 months. ° ° °

## 2022-03-18 ENCOUNTER — Ambulatory Visit (INDEPENDENT_AMBULATORY_CARE_PROVIDER_SITE_OTHER): Payer: 59 | Admitting: Neurology

## 2022-03-18 DIAGNOSIS — G43709 Chronic migraine without aura, not intractable, without status migrainosus: Secondary | ICD-10-CM | POA: Diagnosis not present

## 2022-03-18 MED ORDER — ONABOTULINUMTOXINA 100 UNITS IJ SOLR: 200.0000 [IU] | Freq: Once | INTRAMUSCULAR | Status: AC

## 2022-03-18 NOTE — Progress Notes (Signed)
[facility]  ° °Procedure Note Botox ° °Attending: Dr. Acel Natzke ° °Preoperative Diagnosis(es): Chronic migraine ° °Consent obtained from: The patient °Benefits discussed included, but were not limited to decreased muscle tightness, increased joint range of motion, and decreased pain.  Risk discussed included, but were not limited pain and discomfort, bleeding, bruising, excessive weakness, venous thrombosis, muscle atrophy and dysphagia.  Anticipated outcomes of the procedure as well as he risks and benefits of the alternatives to the procedure, and the roles and tasks of the personnel to be involved, were discussed with the patient, and the patient consents to the procedure and agrees to proceed. A copy of the patient medication guide was given to the patient which explains the blackbox warning. ° °Patients identity and treatment sites confirmed Yes.  . ° °Details of Procedure: °Skin was cleaned with alcohol. Prior to injection, the needle plunger was aspirated to make sure the needle was not within a blood vessel.  There was no blood retrieved on aspiration.   ° °Following is a summary of the muscles injected  And the amount of Botulinum toxin used: ° °Dilution °200 units of Botox was reconstituted with 4 ml of preservative free normal saline. °Time of reconstitution: At the time of the office visit (<30 minutes prior to injection)  ° °Injections  °155 total units of Botox was injected with a 30 gauge needle. ° °Injection Sites: °L occipitalis: 15 units- 3 sites  °R occiptalis: 15 units- 3 sites ° °L upper trapezius: 15 units- 3 sites °R upper trapezius: 15 units- 3 sits          °L paraspinal: 10 units- 2 sites °R paraspinal: 10 units- 2 sites ° °Face °L frontalis(2 injection sites):10 units   °R frontalis(2 injection sites):10 units         °L corrugator: 5 units   °R corrugator: 5 units           °Procerus: 5 units   °L temporalis: 20 units °R temporalis: 20 units  ° °Agent:  °200 units of botulinum Type  A (Onobotulinum Toxin type A) was reconstituted with 4 ml of preservative free normal saline.  °Time of reconstitution: At the time of the office visit (<30 minutes prior to injection)  ° ° ° Total injected (Units):  155 ° Total wasted (Units):  45 ° °Patient tolerated procedure well without complications.   °Reinjection is anticipated in 3 months. ° ° °

## 2022-04-19 ENCOUNTER — Other Ambulatory Visit: Payer: Self-pay | Admitting: Neurology

## 2022-04-24 NOTE — Progress Notes (Unsigned)
NEUROLOGY FOLLOW UP OFFICE NOTE  Helen Rubio [id number]  Assessment/Plan:   Migraine without aura, without status migrainosus, not intractable - cervicogenic component Migraine with aura Cervicalgia   Migraine prevention:  Botox as she has had improvement in regards to severity and frequency.  Will increase nortriptyline to  at bedtime to achieve better migraine control. Migraine rescue:  Nurtec.  Zofran ODT  at earliest onset of migraine. Limit use of pain relievers to no more than 2 days out of week to prevent risk of rebound or medication-overuse headache. Keep headache diary Follow up in 6 months.     Subjective:  Helen Rubio is a 52 year old right-handed female who follows up for migraines.  UPDATE: Started Botox.  Status post 2 rounds. Intensity:  1. 5/10; 2. 3-4/10 Duration:  1. 1-2 days; 2. 1 day Frequency:  1. 4-5 times a month, 2. Twice a month Reports ongoing daily right sided neck pain into shoulder.   Current NSAIDS/analgesics:  ibuprofen Current triptans:  none Current ergotamine:  none Current anti-emetic:  Zofran ODT  Current muscle relaxants:  none Current Antihypertensive medications:  none Current Antidepressant medications:  bupropion Current Anticonvulsant medications:  none Current anti-CGRP:  Nurtec (has samples - has not tried it yet) Current Vitamins/Herbal/Supplements:  none Current Antihistamines/Decongestants:  none Other therapy:  none Hormone/birth control:  none Other medications:  Ambien PRN (only when she travels)  Caffeine:  1 large iced tea.  No coffee or soda Alcohol:  1 glass of red wine twice a week Smoker:  no Diet:  40 oz water daily.  No soda.  Tries to not skip meals. Exercise:  walking Depression:  a little bit; Anxiety:  a little bit.  Bupropion helps Other pain:  neck pain as above. Sleep hygiene:  tries to get 7 hours a night.  A light sleeper.  Chronic fatigue  HISTORY:  Onset:  college.  They were  2 times a week.  Worse in last 6 months, almost daily. Location:  varies - 1.  forehead/behind eyes; 2.  back of neck up occiput Quality:  pressure Intensity:  1.  5/10; 2. 8/10.   Aura:  blurred vision/spots in left eye prior to headache Prodrome:  absent Associated symptoms:  1. Shoulder tension, photophobia, phonophobia; 2. Nausea, photophobia, phonophobia.  She denies associated unilateral numbness or weakness. Duration:  1. 5-6 days; 2. 1 day with rest/treatment Frequency:  1. Daily; 2. Twice a month Frequency of abortive medication: taking ibuprofen daily Triggers:  change in weather, smells (perfumes, cologne), skipped meals Relieving factors:  rest, Advil Activity:  severe aggravating    Past NSAIDS/analgesics:  Cambia, Fioricet Past abortive triptans:  rizatriptan, sumatriptan (caused palpitations) Past abortive ergotamine:  none Past muscle relaxants:  Flexeril.   Past anti-emetic:  none Past antihypertensive medications:  metoprolol Past antidepressant medications:  amitriptyline Past anticonvulsant medications:  topiramate Past anti-CGRP:  sumatriptan (side effects) Past vitamins/Herbal/Supplements:  none Past antihistamines/decongestants:  Flonase Other past therapies:  went to [facility]    Family history of headache:  mom (migraines)  PAST MEDICAL HISTORY: Past Medical History:  Diagnosis Date   Headache(784.0)    Hyperthyroidism    Urinary tract infection    VBAC (vaginal birth after Cesarean) 9(/17) 09/20/2010    MEDICATIONS: Current Outpatient Medications on File Prior to Visit  Medication Sig Dispense Refill   Botulinum Toxin Type A (BOTOX) 200 units SOLR Inject 155 units IM into multiple site in the face,neck and head once  every 90 days 1 each 4   buPROPion (WELLBUTRIN XL) 300 MG 24 hr tablet Take 300 mg by mouth every morning.     butalbital-acetaminophen-caffeine (FIORICET, ESGIC) 50-325-40 MG per tablet Take 1 tablet by mouth 2 (two) times  daily as needed. miagraine     calcium carbonate (TUMS - DOSED IN MG ELEMENTAL CALCIUM) 500 MG chewable tablet Chew 2 tablets by mouth 2 (two) times daily. Heartburn       cyclobenzaprine (FLEXERIL) 10 MG tablet Take 10 mg by mouth at bedtime as needed.     nortriptyline (PAMELOR) 25 MG capsule TAKE 1 CAPSULE BY MOUTH AT BEDTIME. 30 capsule 3   ondansetron (ZOFRAN-ODT) 4 MG disintegrating tablet Take 1 tablet (4 mg total) by mouth every 8 (eight) hours as needed for nausea or vomiting. 20 tablet 5   SUMAtriptan (IMITREX) 100 MG tablet 1 tablet as needed for headache, may repeat once in 2 hours if needed     zolpidem (AMBIEN) 5 MG tablet zolpidem 5 mg tablet  TAKE 1 TABLET BY MOUTH AT BEDTIME ONCE A DAY AS NEEDED FOR INSOMNIA     Current Facility-Administered Medications on File Prior to Visit  Medication Dose Route Frequency Provider Last Rate Last Admin   botulinum toxin Type A (BOTOX) injection 200 Units  200 Units Intramuscular Once Raun Routh R, DO        ALLERGIES: Allergies  Allergen Reactions   Sulfa Antibiotics Hives    FAMILY HISTORY: Family History  Problem Relation Age of Onset   Migraines Mother       Objective:  Blood pressure (!) 151/80, pulse 84, height  (1.702 m), weight 165 lb (74.8 kg), SpO2 98 %, unknown if currently breastfeeding. General: No acute distress.  Patient appears well-groomed.   Head:  Normocephalic/atraumatic Neck:  Supple, full range of motion, right greater than left paraspinal tenderness Heart:  RRR Neuro:  alert and oriented to person, place, and time.  Speech fluent and not dysarthric, language intact.  CN II-XII intact. Bulk and tone normal, muscle strength 5/5 throughout.  Sensation to light touch intact.  Deep tendon reflexes 2+ throughout.  Finger to nose testing intact.  Gait normal, Romberg negative.   Shon Millet, DO  CC: Laurann Montana, MD

## 2022-04-26 ENCOUNTER — Encounter: Payer: Self-pay | Admitting: Neurology

## 2022-04-26 ENCOUNTER — Ambulatory Visit (INDEPENDENT_AMBULATORY_CARE_PROVIDER_SITE_OTHER): Payer: 59 | Admitting: Neurology

## 2022-04-26 VITALS — BP 151/80 | HR 84 | Ht 67.0 in | Wt 165.0 lb

## 2022-04-26 DIAGNOSIS — G43109 Migraine with aura, not intractable, without status migrainosus: Secondary | ICD-10-CM | POA: Diagnosis not present

## 2022-04-26 DIAGNOSIS — M542 Cervicalgia: Secondary | ICD-10-CM | POA: Diagnosis not present

## 2022-04-26 MED ORDER — NORTRIPTYLINE HCL 50 MG PO CAPS: 50.0000 mg | ORAL_CAPSULE | Freq: Every day | ORAL | 11 refills | Status: DC

## 2022-04-26 MED ORDER — NURTEC 75 MG PO TBDP: 75.0000 mg | ORAL_TABLET | Freq: Every day | ORAL | 11 refills | Status: AC | PRN

## 2022-04-26 NOTE — Patient Instructions (Signed)
Continue Botox Increase nortriptyline to  at bedtime Nurtec one day as needed for migraine attack Follow up 6 months.

## 2022-06-03 ENCOUNTER — Other Ambulatory Visit (HOSPITAL_COMMUNITY): Payer: Self-pay

## 2022-06-03 ENCOUNTER — Telehealth: Payer: Self-pay | Admitting: Pharmacy Technician

## 2022-06-03 NOTE — Telephone Encounter (Signed)
Patient Advocate Encounter  Received notification from [facility] that prior authorization for NURTEC 75MG  is required.   PA submitted on 5.31.24 Key [id number] Status is pending

## 2022-06-08 NOTE — Telephone Encounter (Signed)
Patient Advocate Encounter  Prior Authorization for Nurtec 75MG  dispersible tablets has been approved through [employer].   KeyDaniel Rubio   Effective: 06-03-2022 to 06-03-2023

## 2022-06-13 ENCOUNTER — Telehealth: Payer: Self-pay

## 2022-06-13 DIAGNOSIS — G43709 Chronic migraine without aura, not intractable, without status migrainosus: Secondary | ICD-10-CM

## 2022-06-13 MED ORDER — BOTOX 200 UNITS IJ SOLR
INTRAMUSCULAR | 4 refills | Status: AC
Start: 2022-06-13 — End: ?

## 2022-06-14 ENCOUNTER — Other Ambulatory Visit (HOSPITAL_COMMUNITY): Payer: Self-pay

## 2022-06-15 ENCOUNTER — Other Ambulatory Visit (HOSPITAL_COMMUNITY): Payer: Self-pay

## 2022-06-16 NOTE — Telephone Encounter (Signed)
Pt is wanting ot make sure that her botox is here in the office

## 2022-06-17 ENCOUNTER — Ambulatory Visit (INDEPENDENT_AMBULATORY_CARE_PROVIDER_SITE_OTHER): Payer: 59 | Admitting: Neurology

## 2022-06-17 DIAGNOSIS — G43709 Chronic migraine without aura, not intractable, without status migrainosus: Secondary | ICD-10-CM

## 2022-06-17 MED ORDER — ONABOTULINUMTOXINA 100 UNITS IJ SOLR
200.0000 [IU] | Freq: Once | INTRAMUSCULAR | Status: AC
Start: 2022-06-17 — End: 2022-06-17
  Administered 2022-06-17: 155 [IU] via INTRAMUSCULAR

## 2022-06-17 NOTE — Progress Notes (Signed)
[facility]  ° °Procedure Note Botox ° °Attending: Dr. Cerena Baine ° °Preoperative Diagnosis(es): Chronic migraine ° °Consent obtained from: The patient °Benefits discussed included, but were not limited to decreased muscle tightness, increased joint range of motion, and decreased pain.  Risk discussed included, but were not limited pain and discomfort, bleeding, bruising, excessive weakness, venous thrombosis, muscle atrophy and dysphagia.  Anticipated outcomes of the procedure as well as he risks and benefits of the alternatives to the procedure, and the roles and tasks of the personnel to be involved, were discussed with the patient, and the patient consents to the procedure and agrees to proceed. A copy of the patient medication guide was given to the patient which explains the blackbox warning. ° °Patients identity and treatment sites confirmed Yes.  . ° °Details of Procedure: °Skin was cleaned with alcohol. Prior to injection, the needle plunger was aspirated to make sure the needle was not within a blood vessel.  There was no blood retrieved on aspiration.   ° °Following is a summary of the muscles injected  And the amount of Botulinum toxin used: ° °Dilution °200 units of Botox was reconstituted with 4 ml of preservative free normal saline. °Time of reconstitution: At the time of the office visit (<30 minutes prior to injection)  ° °Injections  °155 total units of Botox was injected with a 30 gauge needle. ° °Injection Sites: °L occipitalis: 15 units- 3 sites  °R occiptalis: 15 units- 3 sites ° °L upper trapezius: 15 units- 3 sites °R upper trapezius: 15 units- 3 sits          °L paraspinal: 10 units- 2 sites °R paraspinal: 10 units- 2 sites ° °Face °L frontalis(2 injection sites):10 units   °R frontalis(2 injection sites):10 units         °L corrugator: 5 units   °R corrugator: 5 units           °Procerus: 5 units   °L temporalis: 20 units °R temporalis: 20 units  ° °Agent:  °200 units of botulinum Type  A (Onobotulinum Toxin type A) was reconstituted with 4 ml of preservative free normal saline.  °Time of reconstitution: At the time of the office visit (<30 minutes prior to injection)  ° ° ° Total injected (Units):  155 ° Total wasted (Units):  45 ° °Patient tolerated procedure well without complications.   °Reinjection is anticipated in 3 months. ° ° °

## 2022-07-21 ENCOUNTER — Encounter: Payer: Self-pay | Admitting: Neurology

## 2022-07-22 ENCOUNTER — Other Ambulatory Visit: Payer: Self-pay | Admitting: Neurology

## 2022-07-22 MED ORDER — NORTRIPTYLINE HCL 10 MG PO CAPS: 10.0000 mg | ORAL_CAPSULE | Freq: Every day | ORAL | 5 refills | Status: DC

## 2022-08-10 ENCOUNTER — Other Ambulatory Visit (HOSPITAL_COMMUNITY): Payer: Self-pay

## 2022-08-26 ENCOUNTER — Telehealth: Payer: Self-pay | Admitting: Neurology

## 2022-08-26 NOTE — Telephone Encounter (Signed)
[facility] called to get status of PA that was faxed over for botox . Didn't leave name or number on VM

## 2022-08-31 ENCOUNTER — Other Ambulatory Visit (HOSPITAL_COMMUNITY): Payer: Self-pay

## 2022-08-31 NOTE — Telephone Encounter (Signed)
PA currently active on file until 09-08-2022

## 2022-09-15 NOTE — Telephone Encounter (Signed)
New PA due for Botox.

## 2022-09-16 ENCOUNTER — Ambulatory Visit (INDEPENDENT_AMBULATORY_CARE_PROVIDER_SITE_OTHER): Payer: 59 | Admitting: Neurology

## 2022-09-16 ENCOUNTER — Other Ambulatory Visit (HOSPITAL_COMMUNITY): Payer: Self-pay

## 2022-09-16 ENCOUNTER — Ambulatory Visit: Payer: 59 | Admitting: Neurology

## 2022-09-16 DIAGNOSIS — G43709 Chronic migraine without aura, not intractable, without status migrainosus: Secondary | ICD-10-CM

## 2022-09-16 MED ORDER — ONABOTULINUMTOXINA 100 UNITS IJ SOLR
200.0000 [IU] | Freq: Once | INTRAMUSCULAR | Status: AC
Start: 2022-09-16 — End: 2022-09-16
  Administered 2022-09-16: 155 [IU] via INTRAMUSCULAR

## 2022-09-16 NOTE — Telephone Encounter (Signed)
PA process started, but waiting for question to generate

## 2022-09-16 NOTE — Progress Notes (Signed)
[facility]  ° °Procedure Note Botox ° °Attending: Dr. Freeland Pracht ° °Preoperative Diagnosis(es): Chronic migraine ° °Consent obtained from: The patient °Benefits discussed included, but were not limited to decreased muscle tightness, increased joint range of motion, and decreased pain.  Risk discussed included, but were not limited pain and discomfort, bleeding, bruising, excessive weakness, venous thrombosis, muscle atrophy and dysphagia.  Anticipated outcomes of the procedure as well as he risks and benefits of the alternatives to the procedure, and the roles and tasks of the personnel to be involved, were discussed with the patient, and the patient consents to the procedure and agrees to proceed. A copy of the patient medication guide was given to the patient which explains the blackbox warning. ° °Patients identity and treatment sites confirmed Yes.  . ° °Details of Procedure: °Skin was cleaned with alcohol. Prior to injection, the needle plunger was aspirated to make sure the needle was not within a blood vessel.  There was no blood retrieved on aspiration.   ° °Following is a summary of the muscles injected  And the amount of Botulinum toxin used: ° °Dilution °200 units of Botox was reconstituted with 4 ml of preservative free normal saline. °Time of reconstitution: At the time of the office visit (<30 minutes prior to injection)  ° °Injections  °155 total units of Botox was injected with a 30 gauge needle. ° °Injection Sites: °L occipitalis: 15 units- 3 sites  °R occiptalis: 15 units- 3 sites ° °L upper trapezius: 15 units- 3 sites °R upper trapezius: 15 units- 3 sits          °L paraspinal: 10 units- 2 sites °R paraspinal: 10 units- 2 sites ° °Face °L frontalis(2 injection sites):10 units   °R frontalis(2 injection sites):10 units         °L corrugator: 5 units   °R corrugator: 5 units           °Procerus: 5 units   °L temporalis: 20 units °R temporalis: 20 units  ° °Agent:  °200 units of botulinum Type  A (Onobotulinum Toxin type A) was reconstituted with 4 ml of preservative free normal saline.  °Time of reconstitution: At the time of the office visit (<30 minutes prior to injection)  ° ° ° Total injected (Units):  155 ° Total wasted (Units):  45 ° °Patient tolerated procedure well without complications.   °Reinjection is anticipated in 3 months. ° ° °

## 2022-10-11 ENCOUNTER — Other Ambulatory Visit (HOSPITAL_COMMUNITY): Payer: Self-pay

## 2022-10-11 ENCOUNTER — Telehealth: Payer: Self-pay | Admitting: Pharmacy Technician

## 2022-10-11 NOTE — Telephone Encounter (Signed)
Pharmacy Patient Advocate Encounter  Received notification from [facility] that Prior Authorization for BOTOX 200 has been APPROVED from 10.8.24 to 10.7.25   PA #/Case ID/Reference #: [id number]

## 2022-10-11 NOTE — Telephone Encounter (Signed)
Pharmacy Patient Advocate Encounter   Received notification from [employer] that prior authorization for BOTOX 200 is required/requested.   Insurance verification completed.   The patient is insured through [facility] .   Per test claim: PA required; PA submitted to [facility] via CoverMyMeds Key/confirmation #/EOC [id number] Status is pending

## 2022-10-25 NOTE — Progress Notes (Unsigned)
NEUROLOGY FOLLOW UP OFFICE NOTE  Helen Rubio [id number]  Assessment/Plan:   Migraine without aura, without status migrainosus, not intractable - cervicogenic component Migraine with aura Cervicalgia   Migraine prevention:  Botox every 3 months, nortriptyline 10mg  at bedtime.  *** Migraine rescue:  Nurtec.  Zofran ODT 4mg  at earliest onset of migraine. Limit use of pain relievers to no more than 2 days out of week to prevent risk of rebound or medication-overuse headache. Keep headache diary Follow up ***     Subjective:  Helen Rubio is a 52 year old right-handed female who follows up for migraines.  UPDATE: On Botox.  Increased nortriptyline last time, but she experienced increased side effects (brain fog, weight gain) so we decreased back to 10mg . Intensity:  1. 5/10; 2. 3-4/10 Duration:  1. *** with Nurtec; 2. *** with Nurtec Frequency:  1. 4-5 times a month, 2. Twice a month *** Reports ongoing daily right sided neck pain into shoulder.   Current NSAIDS/analgesics:  ibuprofen Current triptans:  none Current ergotamine:  none Current anti-emetic:  Zofran ODT 4mg  Current muscle relaxants:  none Current Antihypertensive medications:  none Current Antidepressant medications:  nortriptyline 10mg  at bedtime (higher doses caused side effects:  weight gain, brain fog), bupropion Current Anticonvulsant medications:  none Current anti-CGRP:  Nurtec (has samples - has not tried it yet) Current Vitamins/Herbal/Supplements:  none Current Antihistamines/Decongestants:  none Other therapy:  none Hormone/birth control:  none Other medications:  Ambien PRN (only when she travels)  Caffeine:  1 large iced tea.  No coffee or soda Alcohol:  1 glass of red wine twice a week Smoker:  no Diet:  40 oz water daily.  No soda.  Tries to not skip meals. Exercise:  walking Depression:  a little bit; Anxiety:  a little bit.  Bupropion helps Other pain:  neck pain as above. Sleep  hygiene:  tries to get 7 hours a night.  A light sleeper.  Chronic fatigue  HISTORY:  Onset:  college.  They were 2 times a week.  Worse in last 6 months, almost daily. Location:  varies - 1.  forehead/behind eyes; 2.  back of neck up occiput Quality:  pressure Intensity:  1.  5/10; 2. 8/10.   Aura:  blurred vision/spots in left eye prior to headache Prodrome:  absent Associated symptoms:  1. Shoulder tension, photophobia, phonophobia; 2. Nausea, photophobia, phonophobia.  She denies associated unilateral numbness or weakness. Duration:  1. 5-6 days; 2. 1 day with rest/treatment Frequency:  1. Daily; 2. Twice a month Frequency of abortive medication: taking ibuprofen daily Triggers:  change in weather, smells (perfumes, cologne), skipped meals Relieving factors:  rest, Advil Activity:  severe aggravating    Past NSAIDS/analgesics:  Cambia, Fioricet Past abortive triptans:  rizatriptan, sumatriptan (caused palpitations) Past abortive ergotamine:  none Past muscle relaxants:  Flexeril.   Past anti-emetic:  none Past antihypertensive medications:  metoprolol Past antidepressant medications:  amitriptyline Past anticonvulsant medications:  topiramate Past anti-CGRP:  sumatriptan (side effects) Past vitamins/Herbal/Supplements:  none Past antihistamines/decongestants:  Flonase Other past therapies:  went to [facility]    Family history of headache:  mom (migraines)  PAST MEDICAL HISTORY: Past Medical History:  Diagnosis Date   Headache(784.0)    Hyperthyroidism    Urinary tract infection    VBAC (vaginal birth after Cesarean) 9(/17) 09/20/2010    MEDICATIONS: Current Outpatient Medications on File Prior to Visit  Medication Sig Dispense Refill   botulinum toxin Type A (BOTOX)  200 units injection Inject 155 units IM into multiple site in the face,neck and head once every 90 days 1 each 4   buPROPion (WELLBUTRIN XL) 300 MG 24 hr tablet Take 300 mg by mouth every  morning.     butalbital-acetaminophen-caffeine (FIORICET, ESGIC) 50-325-40 MG per tablet Take 1 tablet by mouth 2 (two) times daily as needed. miagraine     calcium carbonate (TUMS - DOSED IN MG ELEMENTAL CALCIUM) 500 MG chewable tablet Chew 2 tablets by mouth 2 (two) times daily. Heartburn       cyclobenzaprine (FLEXERIL) 10 MG tablet Take 10 mg by mouth at bedtime as needed.     nortriptyline (PAMELOR) 10 MG capsule Take 1 capsule (10 mg total) by mouth at bedtime. 30 capsule 5   ondansetron (ZOFRAN-ODT) 4 MG disintegrating tablet Take 1 tablet (4 mg total) by mouth every 8 (eight) hours as needed for nausea or vomiting. 20 tablet 5   Rimegepant Sulfate (NURTEC) 75 MG TBDP Take 1 tablet (75 mg total) by mouth daily as needed. 8 tablet 11   SUMAtriptan (IMITREX) 100 MG tablet 1 tablet as needed for headache, may repeat once in 2 hours if needed     zolpidem (AMBIEN) 5 MG tablet zolpidem 5 mg tablet  TAKE 1 TABLET BY MOUTH AT BEDTIME ONCE A DAY AS NEEDED FOR INSOMNIA     Current Facility-Administered Medications on File Prior to Visit  Medication Dose Route Frequency Provider Last Rate Last Admin   botulinum toxin Type A (BOTOX) injection 200 Units  200 Units Intramuscular Once Everlena Cooper, Jordani Nunn R, DO        ALLERGIES: Allergies  Allergen Reactions   Sulfa Antibiotics Hives    FAMILY HISTORY: Family History  Problem Relation Age of Onset   Migraines Mother       Objective:  *** General: No acute distress.  Patient appears well-groomed.   Head:  Normocephalic/atraumatic Neck:  Supple, full range of motion, right greater than left paraspinal tenderness Heart:  RRR Neuro:  ***   Shon Millet, DO  CC: Laurann Montana, MD

## 2022-10-26 ENCOUNTER — Encounter: Payer: Self-pay | Admitting: Neurology

## 2022-10-26 ENCOUNTER — Ambulatory Visit (INDEPENDENT_AMBULATORY_CARE_PROVIDER_SITE_OTHER): Payer: 59 | Admitting: Neurology

## 2022-10-26 VITALS — BP 127/80 | HR 86 | Ht 67.0 in | Wt 166.0 lb

## 2022-10-26 DIAGNOSIS — G43709 Chronic migraine without aura, not intractable, without status migrainosus: Secondary | ICD-10-CM

## 2022-10-26 DIAGNOSIS — M5412 Radiculopathy, cervical region: Secondary | ICD-10-CM | POA: Diagnosis not present

## 2022-10-26 MED ORDER — EMGALITY 120 MG/ML ~~LOC~~ SOAJ: 240.0000 mg | Freq: Once | SUBCUTANEOUS | 0 refills | Status: DC

## 2022-10-26 NOTE — Patient Instructions (Signed)
Continue Botox.  Start Emgality - 2 injections for first dose.  Then 1 injection every 28 days thereafter.  Contact me when you pick up the first dose (check to make sure it is for 2 pens) and then I will send prescription for standing order (1 injection every 28 days) Nurtec once daily as needed MRI of cervical spine Limit use of pain relievers to no more than 2 days out of week to prevent risk of rebound or medication-overuse headache. Keep headache diary Follow up 6 months.

## 2022-11-08 ENCOUNTER — Ambulatory Visit
Admission: RE | Admit: 2022-11-08 | Discharge: 2022-11-08 | Disposition: A | Discharge status: Home / Self Care | Payer: 59 | Source: Ambulatory Visit | Attending: Neurology

## 2022-11-08 DIAGNOSIS — M5412 Radiculopathy, cervical region: Secondary | ICD-10-CM

## 2022-11-10 ENCOUNTER — Telehealth: Payer: Self-pay | Admitting: Neurology

## 2022-11-10 NOTE — Telephone Encounter (Signed)
Pt called in stating Dr. Everlena Cooper wanted her to get some blood work done when she went to her OBGYN. The OBGYN's office is wanting to find out exactly which tests he wants to be done? She says her appointment is Monday

## 2022-11-11 ENCOUNTER — Encounter: Payer: Self-pay | Admitting: Neurology

## 2022-11-29 ENCOUNTER — Telehealth: Payer: Self-pay

## 2022-11-29 DIAGNOSIS — M542 Cervicalgia: Secondary | ICD-10-CM

## 2022-12-23 ENCOUNTER — Ambulatory Visit (INDEPENDENT_AMBULATORY_CARE_PROVIDER_SITE_OTHER): Payer: 59 | Admitting: Neurology

## 2022-12-23 DIAGNOSIS — G43709 Chronic migraine without aura, not intractable, without status migrainosus: Secondary | ICD-10-CM | POA: Diagnosis not present

## 2022-12-23 MED ORDER — ONABOTULINUMTOXINA 100 UNITS IJ SOLR
200.0000 [IU] | Freq: Once | INTRAMUSCULAR | Status: AC
  Administered 2022-12-23: 155 [IU] via INTRAMUSCULAR

## 2022-12-23 NOTE — Progress Notes (Signed)

## 2023-01-18 ENCOUNTER — Encounter: Payer: Self-pay | Admitting: Neurology

## 2023-01-19 ENCOUNTER — Telehealth: Payer: Self-pay

## 2023-01-19 NOTE — Telephone Encounter (Signed)
PA needed for Emgality.

## 2023-01-20 ENCOUNTER — Other Ambulatory Visit: Payer: Self-pay | Admitting: Neurology

## 2023-01-20 MED ORDER — EMGALITY 120 MG/ML ~~LOC~~ SOAJ: 240.0000 mg | Freq: Once | SUBCUTANEOUS | 0 refills | Status: AC

## 2023-01-23 ENCOUNTER — Other Ambulatory Visit (HOSPITAL_COMMUNITY): Payer: Self-pay

## 2023-01-25 ENCOUNTER — Other Ambulatory Visit (HOSPITAL_COMMUNITY): Payer: Self-pay

## 2023-01-25 ENCOUNTER — Telehealth: Payer: Self-pay

## 2023-01-25 ENCOUNTER — Encounter: Payer: Self-pay | Admitting: Neurology

## 2023-01-25 ENCOUNTER — Telehealth: Payer: Self-pay | Admitting: Pharmacy Technician

## 2023-01-25 NOTE — Telephone Encounter (Signed)
PA has been submitted, and telephone encounter has been created. Please see telephone encounter dated 1.22.25 for Emgality.  PA for Nurtect is still valid until 06/03/2023

## 2023-01-25 NOTE — Telephone Encounter (Signed)
Message sent to PA team to start PA for emgality and nurtec

## 2023-01-25 NOTE — Telephone Encounter (Signed)
Pharmacy Patient Advocate Encounter   Received notification from Pt Calls Messages that prior authorization for EMGAILTY 120MG  is required/requested.   Insurance verification completed.   The patient is insured through [facility] .   Per test claim: PA required; PA submitted to above mentioned insurance via CoverMyMeds Key/confirmation #/EOC [facility] Status is pending

## 2023-01-25 NOTE — Telephone Encounter (Signed)
Pt sent a my chart message she needs a PA for her emgality and nurtec

## 2023-01-26 ENCOUNTER — Other Ambulatory Visit (HOSPITAL_COMMUNITY): Payer: Self-pay

## 2023-01-27 NOTE — Telephone Encounter (Signed)
Pharmacy Patient Advocate Encounter  Received notification from [facility] that Prior Authorization for  [facility] 120MG  has been APPROVED from 01-25-2023 to 04-25-2023   PA #/Case ID/Reference #: [facility]

## 2023-02-02 NOTE — Telephone Encounter (Signed)
PA request has been Approved. New Encounter created for follow up. For additional info see Pharmacy Prior Auth telephone encounter from 01/25/23.

## 2023-03-22 ENCOUNTER — Other Ambulatory Visit: Payer: Self-pay | Admitting: Neurology

## 2023-03-24 ENCOUNTER — Ambulatory Visit: Payer: 59 | Admitting: Neurology

## 2023-04-26 ENCOUNTER — Telehealth: Payer: Self-pay | Admitting: Pharmacy Technician

## 2023-04-26 ENCOUNTER — Other Ambulatory Visit (HOSPITAL_COMMUNITY): Payer: Self-pay

## 2023-04-26 NOTE — Telephone Encounter (Signed)
 Pharmacy Patient Advocate Encounter   Received notification from CoverMyMeds that prior authorization for EMGALITY  120MG  is required/requested.   Insurance verification completed.   The patient is insured through [facility] .   Per test claim: PA required; PA submitted to above mentioned insurance via CoverMyMeds Key/confirmation #/EOC [id number] Status is pending

## 2023-04-26 NOTE — Telephone Encounter (Signed)
 Pharmacy Patient Advocate Encounter  Received notification from [facility] that Prior Authorization for EMGALITY  120MG  has been APPROVED from 4.23.25 to 4.23.26. Ran test claim, Copay is $35. This test claim was processed through [facility]- copay amounts may vary at other pharmacies due to pharmacy/plan contracts, or as the patient moves through the different stages of their insurance plan.   PA #/Case ID/Reference #: [id number]

## 2023-05-01 ENCOUNTER — Ambulatory Visit: Payer: 59 | Admitting: Neurology

## 2023-05-11 ENCOUNTER — Ambulatory Visit: Payer: 59 | Admitting: Neurology

## 2023-06-06 ENCOUNTER — Telehealth: Payer: Self-pay | Admitting: Pharmacist

## 2023-06-06 ENCOUNTER — Other Ambulatory Visit (HOSPITAL_COMMUNITY): Payer: Self-pay

## 2023-06-06 NOTE — Telephone Encounter (Signed)
 Pharmacy Patient Advocate Encounter  Received notification from [facility] that Prior Authorization for Nurtec 75MG  dispersible tablets has been APPROVED from 06/06/2023 to 06/05/2024   PA #/Case ID/Reference #:  [id number]

## 2023-09-12 ENCOUNTER — Other Ambulatory Visit (HOSPITAL_COMMUNITY): Payer: Self-pay

## 2023-09-15 ENCOUNTER — Other Ambulatory Visit: Payer: Self-pay | Admitting: Neurology

## 2023-09-15 ENCOUNTER — Telehealth: Payer: Self-pay | Admitting: Neurology

## 2023-09-15 MED ORDER — EMGALITY 120 MG/ML ~~LOC~~ SOAJ: 240.0000 mg | Freq: Once | SUBCUTANEOUS | 0 refills | Status: DC

## 2023-09-15 NOTE — Telephone Encounter (Signed)
 Pt called and LMOM- wants to know if jaffe can send in a new RX for emgality . [facility] old one expired  [facility] [address]

## 2023-09-15 NOTE — Telephone Encounter (Signed)
 Patient never started Emgality . She Wants to start.

## 2023-09-18 ENCOUNTER — Other Ambulatory Visit (HOSPITAL_COMMUNITY): Payer: Self-pay

## 2023-09-18 ENCOUNTER — Other Ambulatory Visit: Payer: Self-pay | Admitting: Neurology

## 2023-09-18 NOTE — Telephone Encounter (Signed)
 Previous PA approval on file

## 2023-09-21 NOTE — Telephone Encounter (Signed)
 Mychart message and VM left per DR.Jaffe [facility] dose [facility] [address] Let patient know but she needs to contact us  when she picks up the prescription so we can send over the standing order.

## 2023-11-07 ENCOUNTER — Ambulatory Visit: Admitting: Neurology

## 2024-02-05 ENCOUNTER — Ambulatory Visit: Admitting: Neurology

## 2024-05-03 ENCOUNTER — Ambulatory Visit: Admitting: Neurology
